# Patient Record
Sex: Female | Born: 1940 | Race: White | Hispanic: No | Marital: Married | Smoking: Never smoker
Health system: Southern US, Community
[De-identification: ages and names within clinical notes are randomized; demographics above are authoritative.]

## PROBLEM LIST (undated history)

## (undated) DIAGNOSIS — M8580 Other specified disorders of bone density and structure, unspecified site: Secondary | ICD-10-CM

## (undated) DIAGNOSIS — T7840XA Allergy, unspecified, initial encounter: Secondary | ICD-10-CM

## (undated) DIAGNOSIS — I10 Essential (primary) hypertension: Secondary | ICD-10-CM

## (undated) DIAGNOSIS — H269 Unspecified cataract: Secondary | ICD-10-CM

## (undated) DIAGNOSIS — K5730 Diverticulosis of large intestine without perforation or abscess without bleeding: Secondary | ICD-10-CM

## (undated) DIAGNOSIS — M810 Age-related osteoporosis without current pathological fracture: Secondary | ICD-10-CM

## (undated) HISTORY — PX: TONSILLECTOMY: SUR1361

## (undated) HISTORY — DX: Allergy, unspecified, initial encounter: T78.40XA

## (undated) HISTORY — DX: Age-related osteoporosis without current pathological fracture: M81.0

## (undated) HISTORY — DX: Essential (primary) hypertension: I10

## (undated) HISTORY — DX: Other specified disorders of bone density and structure, unspecified site: M85.80

## (undated) HISTORY — PX: OOPHORECTOMY: SHX86

## (undated) HISTORY — DX: Unspecified cataract: H26.9

## (undated) HISTORY — PX: COLONOSCOPY: SHX174

## (undated) HISTORY — PX: POLYPECTOMY: SHX149

## (undated) HISTORY — PX: OTHER SURGICAL HISTORY: SHX169

## (undated) HISTORY — DX: Diverticulosis of large intestine without perforation or abscess without bleeding: K57.30

## (undated) HISTORY — PX: TOTAL ABDOMINAL HYSTERECTOMY: SHX209

---

## 2003-02-10 ENCOUNTER — Ambulatory Visit (HOSPITAL_COMMUNITY): Admission: RE | Admit: 2003-02-10 | Discharge: 2003-02-10 | Payer: Self-pay | Admitting: Internal Medicine

## 2003-11-11 ENCOUNTER — Ambulatory Visit: Payer: Self-pay | Admitting: Internal Medicine

## 2003-12-03 ENCOUNTER — Ambulatory Visit: Payer: Self-pay | Admitting: Internal Medicine

## 2003-12-19 ENCOUNTER — Ambulatory Visit: Payer: Self-pay | Admitting: Internal Medicine

## 2004-01-20 ENCOUNTER — Ambulatory Visit: Payer: Self-pay | Admitting: Family Medicine

## 2004-02-11 ENCOUNTER — Ambulatory Visit (HOSPITAL_COMMUNITY): Admission: RE | Admit: 2004-02-11 | Discharge: 2004-02-11 | Payer: Self-pay | Admitting: Internal Medicine

## 2004-03-01 ENCOUNTER — Ambulatory Visit: Payer: Self-pay | Admitting: Internal Medicine

## 2004-03-31 ENCOUNTER — Ambulatory Visit: Payer: Self-pay | Admitting: Internal Medicine

## 2004-11-08 ENCOUNTER — Ambulatory Visit: Payer: Self-pay | Admitting: Internal Medicine

## 2005-02-16 ENCOUNTER — Ambulatory Visit (HOSPITAL_COMMUNITY): Admission: RE | Admit: 2005-02-16 | Discharge: 2005-02-16 | Payer: Self-pay | Admitting: Internal Medicine

## 2005-11-17 ENCOUNTER — Ambulatory Visit: Payer: Self-pay | Admitting: Internal Medicine

## 2005-12-12 ENCOUNTER — Ambulatory Visit: Payer: Self-pay | Admitting: Internal Medicine

## 2005-12-16 ENCOUNTER — Ambulatory Visit: Payer: Self-pay | Admitting: Internal Medicine

## 2006-06-16 ENCOUNTER — Ambulatory Visit (HOSPITAL_COMMUNITY): Admission: RE | Admit: 2006-06-16 | Discharge: 2006-06-16 | Payer: Self-pay | Admitting: Internal Medicine

## 2006-08-28 ENCOUNTER — Telehealth: Payer: Self-pay | Admitting: Internal Medicine

## 2006-09-04 ENCOUNTER — Telehealth: Payer: Self-pay | Admitting: Internal Medicine

## 2006-09-05 ENCOUNTER — Telehealth (INDEPENDENT_AMBULATORY_CARE_PROVIDER_SITE_OTHER): Payer: Self-pay | Admitting: *Deleted

## 2006-10-17 ENCOUNTER — Ambulatory Visit: Payer: Self-pay | Admitting: Internal Medicine

## 2006-10-17 DIAGNOSIS — M810 Age-related osteoporosis without current pathological fracture: Secondary | ICD-10-CM | POA: Insufficient documentation

## 2006-10-17 DIAGNOSIS — M8580 Other specified disorders of bone density and structure, unspecified site: Secondary | ICD-10-CM

## 2006-10-23 ENCOUNTER — Telehealth (INDEPENDENT_AMBULATORY_CARE_PROVIDER_SITE_OTHER): Payer: Self-pay | Admitting: *Deleted

## 2006-10-30 ENCOUNTER — Encounter: Payer: Self-pay | Admitting: Internal Medicine

## 2006-10-30 ENCOUNTER — Ambulatory Visit: Payer: Self-pay | Admitting: Internal Medicine

## 2006-11-02 ENCOUNTER — Ambulatory Visit: Payer: Self-pay | Admitting: Internal Medicine

## 2006-11-03 ENCOUNTER — Telehealth: Payer: Self-pay | Admitting: Internal Medicine

## 2006-12-01 ENCOUNTER — Telehealth: Payer: Self-pay | Admitting: Internal Medicine

## 2006-12-04 ENCOUNTER — Telehealth: Payer: Self-pay | Admitting: Internal Medicine

## 2006-12-05 ENCOUNTER — Encounter: Payer: Self-pay | Admitting: Internal Medicine

## 2006-12-11 ENCOUNTER — Ambulatory Visit: Payer: Self-pay | Admitting: Internal Medicine

## 2007-01-16 ENCOUNTER — Ambulatory Visit: Payer: Self-pay | Admitting: Internal Medicine

## 2007-01-16 LAB — CONVERTED CEMR LAB
Bilirubin Urine: NEGATIVE
Glucose, Urine, Semiquant: NEGATIVE
Nitrite: NEGATIVE
Protein, U semiquant: NEGATIVE
Specific Gravity, Urine: 1.02
Urobilinogen, UA: 0.2
WBC Urine, dipstick: NEGATIVE
pH: 5.5

## 2007-01-17 LAB — CONVERTED CEMR LAB
AST: 25 units/L (ref 0–37)
Alkaline Phosphatase: 41 units/L (ref 39–117)
Basophils Relative: 0.3 % (ref 0.0–1.0)
CO2: 28 meq/L (ref 19–32)
Calcium: 9.6 mg/dL (ref 8.4–10.5)
Cholesterol: 211 mg/dL (ref 0–200)
Eosinophils Absolute: 0.1 10*3/uL (ref 0.0–0.6)
GFR calc Af Amer: 108 mL/min
GFR calc non Af Amer: 89 mL/min
Glucose, Bld: 96 mg/dL (ref 70–99)
Hemoglobin: 13.3 g/dL (ref 12.0–15.0)
MCHC: 35.2 g/dL (ref 30.0–36.0)
MCV: 91.5 fL (ref 78.0–100.0)
Monocytes Absolute: 0.3 10*3/uL (ref 0.2–0.7)
Monocytes Relative: 6.3 % (ref 3.0–11.0)
Platelets: 264 10*3/uL (ref 150–400)
RBC: 4.13 M/uL (ref 3.87–5.11)
RDW: 12.9 % (ref 11.5–14.6)
TSH: 0.87 microintl units/mL (ref 0.35–5.50)
Total Bilirubin: 1.1 mg/dL (ref 0.3–1.2)
Total Protein: 7 g/dL (ref 6.0–8.3)

## 2007-01-19 ENCOUNTER — Ambulatory Visit: Payer: Self-pay | Admitting: Cardiology

## 2007-01-22 ENCOUNTER — Telehealth: Payer: Self-pay | Admitting: Internal Medicine

## 2007-01-29 ENCOUNTER — Encounter: Payer: Self-pay | Admitting: Internal Medicine

## 2007-02-14 ENCOUNTER — Encounter: Payer: Self-pay | Admitting: Internal Medicine

## 2007-06-11 ENCOUNTER — Other Ambulatory Visit: Admission: RE | Admit: 2007-06-11 | Discharge: 2007-06-11 | Payer: Self-pay | Admitting: Internal Medicine

## 2007-06-11 ENCOUNTER — Ambulatory Visit: Payer: Self-pay | Admitting: Internal Medicine

## 2007-06-11 ENCOUNTER — Encounter: Payer: Self-pay | Admitting: Internal Medicine

## 2007-06-12 ENCOUNTER — Encounter: Payer: Self-pay | Admitting: Internal Medicine

## 2007-06-12 LAB — CONVERTED CEMR LAB
WBC, Wet Prep HPF POC: NONE SEEN
Yeast Wet Prep HPF POC: NONE SEEN

## 2007-06-20 ENCOUNTER — Ambulatory Visit (HOSPITAL_COMMUNITY): Admission: RE | Admit: 2007-06-20 | Discharge: 2007-06-20 | Payer: Self-pay | Admitting: Internal Medicine

## 2007-08-07 ENCOUNTER — Telehealth: Payer: Self-pay | Admitting: Internal Medicine

## 2007-09-12 ENCOUNTER — Encounter: Payer: Self-pay | Admitting: Internal Medicine

## 2007-10-31 ENCOUNTER — Ambulatory Visit: Payer: Self-pay | Admitting: Internal Medicine

## 2008-10-07 ENCOUNTER — Ambulatory Visit (HOSPITAL_COMMUNITY): Admission: RE | Admit: 2008-10-07 | Discharge: 2008-10-07 | Payer: Self-pay | Admitting: Internal Medicine

## 2008-10-15 ENCOUNTER — Encounter (INDEPENDENT_AMBULATORY_CARE_PROVIDER_SITE_OTHER): Payer: Self-pay | Admitting: *Deleted

## 2008-11-27 ENCOUNTER — Encounter (INDEPENDENT_AMBULATORY_CARE_PROVIDER_SITE_OTHER): Payer: Self-pay | Admitting: *Deleted

## 2008-12-01 ENCOUNTER — Ambulatory Visit: Payer: Self-pay | Admitting: Internal Medicine

## 2008-12-10 ENCOUNTER — Ambulatory Visit: Payer: Self-pay | Admitting: Internal Medicine

## 2008-12-15 ENCOUNTER — Ambulatory Visit: Payer: Self-pay | Admitting: Internal Medicine

## 2008-12-15 ENCOUNTER — Encounter: Payer: Self-pay | Admitting: Internal Medicine

## 2008-12-24 ENCOUNTER — Ambulatory Visit: Payer: Self-pay | Admitting: Internal Medicine

## 2009-01-12 ENCOUNTER — Telehealth: Payer: Self-pay | Admitting: Internal Medicine

## 2009-03-02 ENCOUNTER — Telehealth: Payer: Self-pay | Admitting: Internal Medicine

## 2009-10-13 ENCOUNTER — Ambulatory Visit: Payer: Self-pay | Admitting: Internal Medicine

## 2009-10-29 ENCOUNTER — Ambulatory Visit (HOSPITAL_COMMUNITY): Admission: RE | Admit: 2009-10-29 | Discharge: 2009-10-29 | Payer: Self-pay | Admitting: Internal Medicine

## 2010-01-28 ENCOUNTER — Other Ambulatory Visit: Payer: Self-pay | Admitting: Internal Medicine

## 2010-01-28 ENCOUNTER — Ambulatory Visit
Admission: RE | Admit: 2010-01-28 | Discharge: 2010-01-28 | Payer: Self-pay | Source: Home / Self Care | Attending: Internal Medicine | Admitting: Internal Medicine

## 2010-01-28 LAB — BASIC METABOLIC PANEL
BUN: 10 mg/dL (ref 6–23)
CO2: 30 mEq/L (ref 19–32)
Calcium: 9.4 mg/dL (ref 8.4–10.5)
Chloride: 102 mEq/L (ref 96–112)
Creatinine, Ser: 0.7 mg/dL (ref 0.4–1.2)
GFR: 89.81 mL/min (ref >60.00)
Glucose, Bld: 97 mg/dL (ref 70–99)
Potassium: 4.7 mEq/L (ref 3.5–5.1)
Sodium: 139 mEq/L (ref 135–145)

## 2010-01-28 LAB — LIPID PANEL
Cholesterol: 211 mg/dL — ABNORMAL HIGH (ref 0–200)
HDL: 91.4 mg/dL (ref >39.00)
Total CHOL/HDL Ratio: 2
Triglycerides: 51 mg/dL (ref 0.0–149.0)
VLDL: 10.2 mg/dL (ref 0.0–40.0)

## 2010-01-28 LAB — HEPATIC FUNCTION PANEL
ALT: 16 U/L (ref 0–35)
AST: 21 U/L (ref 0–37)
Albumin: 4.4 g/dL (ref 3.5–5.2)
Alkaline Phosphatase: 57 U/L (ref 39–117)
Bilirubin, Direct: 0.2 mg/dL (ref 0.0–0.3)
Total Bilirubin: 0.7 mg/dL (ref 0.3–1.2)
Total Protein: 7.1 g/dL (ref 6.0–8.3)

## 2010-01-28 LAB — CONVERTED CEMR LAB
Nitrite: NEGATIVE
Protein, U semiquant: NEGATIVE
pH: 7

## 2010-01-28 LAB — CBC WITH DIFFERENTIAL/PLATELET
Basophils Absolute: 0 10*3/uL (ref 0.0–0.1)
Basophils Relative: 0.8 % (ref 0.0–3.0)
Eosinophils Absolute: 0.1 10*3/uL (ref 0.0–0.7)
Eosinophils Relative: 3.2 % (ref 0.0–5.0)
HCT: 38.9 % (ref 36.0–46.0)
Hemoglobin: 13.1 g/dL (ref 12.0–15.0)
Lymphocytes Relative: 47.1 % — ABNORMAL HIGH (ref 12.0–46.0)
Lymphs Abs: 1.9 10*3/uL (ref 0.7–4.0)
MCHC: 33.8 g/dL (ref 30.0–36.0)
MCV: 93.7 fl (ref 78.0–100.0)
Monocytes Absolute: 0.3 10*3/uL (ref 0.1–1.0)
Monocytes Relative: 6.5 % (ref 3.0–12.0)
Neutro Abs: 1.7 10*3/uL (ref 1.4–7.7)
Neutrophils Relative %: 42.4 % — ABNORMAL LOW (ref 43.0–77.0)
Platelets: 233 10*3/uL (ref 150.0–400.0)
RBC: 4.15 Mil/uL (ref 3.87–5.11)
RDW: 14 % (ref 11.5–14.6)
WBC: 4.1 10*3/uL — ABNORMAL LOW (ref 4.5–10.5)

## 2010-01-28 LAB — TSH: TSH: 1.16 u[IU]/mL (ref 0.35–5.50)

## 2010-01-28 LAB — LDL CHOLESTEROL, DIRECT: Direct LDL: 105.8 mg/dL

## 2010-01-30 ENCOUNTER — Encounter: Payer: Self-pay | Admitting: Internal Medicine

## 2010-02-09 ENCOUNTER — Ambulatory Visit
Admission: RE | Admit: 2010-02-09 | Discharge: 2010-02-09 | Payer: Self-pay | Source: Home / Self Care | Attending: Internal Medicine | Admitting: Internal Medicine

## 2010-02-09 DIAGNOSIS — K219 Gastro-esophageal reflux disease without esophagitis: Secondary | ICD-10-CM | POA: Insufficient documentation

## 2010-02-09 DIAGNOSIS — G4700 Insomnia, unspecified: Secondary | ICD-10-CM | POA: Insufficient documentation

## 2010-02-09 NOTE — Progress Notes (Signed)
Summary: returning a call  Phone Note Call from Patient Call back at [phone]   Caller: Spouse-live call Summary of Call: [facility]  Initial call taken by: Warnell Forester,  January 12, 2009 8:20 AM  Follow-up for Phone Call        Pt given results. Follow-up by: Lynann Beaver CMA,  January 12, 2009 9:59 AM     Appended Document: returning a call Called again & left voice message that she is returning call from Triage.  Called back to number left [phone] and got voice mail.  Left message to call back if she has any questions or problems, that there are not any messages to relay.  Appended Document: returning a call Called back & left another message and different number [phone].  Bone density test results.    Appended Document: returning a call Called patient & gave Dr. Cato Mulligan report.

## 2010-02-09 NOTE — Assessment & Plan Note (Signed)
Summary: flu shot//ccm   Nurse Visit   Allergies: 1)  ! Fosamax (Alendronate Sodium) 2)  ! Actonel (Risedronate Sodium)  Immunizations Administered:  Influenza Vaccine # 1:    Vaccine Type: Fluvax 3+    Site: left deltoid    Mfr: [employer]oSmith[employer]    Dose: 0.5 ml    Route: IM    Given by: Kathrynn Speed CMA    Exp. Date: 07/10/2010    Lot #: [id number]    VIS given: 08/04/09 version given October 13, 2009.  Flu Vaccine Consent Questions:    Do you have a history of severe allergic reactions to this vaccine? no    Any prior history of allergic reactions to egg and/or gelatin? no    Do you have a sensitivity to the preservative Thimersol? no    Do you have a past history of Guillan-Barre Syndrome? no    Do you currently have an acute febrile illness? no    Have you ever had a severe reaction to latex? no    Vaccine information given and explained to patient? yes    Are you currently pregnant? no  Orders Added: 1)  Flu Vaccine 70yrs + [90658] 2)  Admin 1st Vaccine [54270]

## 2010-02-09 NOTE — Progress Notes (Signed)
Summary: OTC OR RX FOR SWIMMER EAR  Phone Note Call from Patient Call back at Home Phone [phone]   Caller: Patient   [phone] Call For: Birdie Sons MD Summary of Call: PT WOULD LIKE EAR DROPS FOR SWIMMERS EAR . [facility] [phone] Initial call taken by: Heron Sabins,  March 02, 2009 9:30 AM  Follow-up for Phone Call        what are the symptoms? Follow-up by: Birdie Sons MD,  March 02, 2009 10:58 AM  Additional Follow-up for Phone Call Additional follow up Details #1::        Pt adv that she has been swimming at the [facility] recently and  since has been exp L ear pain, painful to the touch... pt denies any fever or congestion. Additional Follow-up by: Debbra Riding,  March 02, 2009 12:21 PM    Additional Follow-up for Phone Call Additional follow up Details #2::    cortisporin otic to affected ear four times a day for 5 days Follow-up by: Birdie Sons MD,  March 02, 2009 12:59 PM  Additional Follow-up for Phone Call Additional follow up Details #3:: Details for Additional Follow-up Action Taken: see Rx.Patient notified.  Additional Follow-up by: Gladis Riffle, RN,  March 02, 2009 2:28 PM  New/Updated Medications: CORTISPORIN 3.5-10000-1 SOLN [facility]) One drop in affected ear four times a day x 5 days Prescriptions: CORTISPORIN 3.5-10000-1 SOLN (NEOMYCIN-POLYMYXIN-HC) One drop in affected ear four times a day x 5 days  #1 bottle x 0   Entered by:   Gladis Riffle, RN   Authorized by:   Birdie Sons MD   Signed by:   Gladis Riffle, RN on 03/02/2009   Method used:   [facility]        [phone][facility] (retail)       [address]       [place], [place]  [address]       Ph: [phone] or [phone]       Fax: [phone]   RxID:   [id number]

## 2010-02-17 NOTE — Assessment & Plan Note (Signed)
Summary: cpx/cjr   Vital Signs:  Patient profile:   70 year old female Height:      63.5 inches Weight:      151 pounds BMI:     26.42 Temp:     98.1 degrees F oral Pulse rate:   64 / minute Pulse rhythm:   regular BP sitting:   104 / 80  (left arm) Cuff size:   regular  Vitals Entered By: Alfred Levins, CMA (February 09, 2010 9:17 AM) CC: cpx, no pap   CC:  cpx and no pap.  History of Present Illness: cpx  she has 2 new complaints GERD---she started OTC PPI with success  Insomnia---ongoing for months- years. she wakes up q 2 hours. she is able to fall back asleep sometimes can take an hour. she does not nap during the day.   All other systems reviewed and were negative   Current Problems (verified): 1)  Preventive Health Care  (ICD-V70.0) 2)  Osteopenia  (ICD-733.90)  Current Medications (verified): 1)  Albuterol 90 Mcg/act Aers (Albuterol) .... Inhale 2 Puff As Directed Four Times A  Day Prn 2)  Vitamin D3 5000 Unit Caps (Cholecalciferol) .... Once Daily 3)  Aspirin 81 Mg  Tbec (Aspirin) .... One By Mouth Every Day 4)  Prevacid 30 Mg Cpdr (Lansoprazole) .Marland Kitchen.. 1 By Mouth Once Daily  Allergies (verified): 1)  ! Fosamax (Alendronate Sodium) 2)  ! Actonel (Risedronate Sodium)  Past History:  Past Surgical History: Last updated: 07/10/2006 Hysterectomy, uterine hyperplasia, no ca Oophorectomy, BSO  age 72  Family History: Last updated: 01-31-2007 mother decease 47 yo hx of CAD (elderly). Hx of CAD earlier in life father colon ca Family History of Colon CA 1st degree relative   Social History: Last updated: January 31, 2007 Married Never Smoked Regular exercise-no Alcohol use-no Regular exercise-yes walks daily  Risk Factors: Exercise: yes (2007-01-31)  Risk Factors: Smoking Status: never (12/11/2006)  Physical Exam  General:  alert and well-developed.   Head:  normocephalic and atraumatic.   Eyes:  pupils equal and pupils round.   Ears:  R ear normal  and L ear normal.   Nose:  no external deformity and no external erythema.   Neck:  No deformities, masses, or tenderness noted. Lungs:  normal respiratory effort and no intercostal retractions.   Heart:  normal rate and regular rhythm.   Abdomen:  soft and non-tender.   Msk:  No deformity or scoliosis noted of thoracic or lumbar spine.   Neurologic:  cranial nerves II-XII intact and gait normal.   Skin:  turgor normal and color normal.   Psych:  good eye contact and not anxious appearing.     Impression & Recommendations:  Problem # 1:  PREVENTIVE HEALTH CARE (ICD-V70.0) health maint UTD encouraged to maintain excellent health habits  Problem # 2:  GERD (ICD-530.81) discussed I've advised her to minimize medicaitons---she is taking OTC med Her updated medication list for this problem includes:    Prevacid15Mg  Cpdr (Lansoprazole) .Marland Kitchen... 1 by mouth once daily  Problem # 3:  INSOMNIA-SLEEP DISORDER-UNSPEC (ICD-780.52) discussed trial melatonin  Complete Medication List: 1)  Albuterol 90 Mcg/act Aers (Albuterol) .... Inhale 2 puff as directed four times a  day prn 2)  Vitamin D3 5000 Unit Caps (Cholecalciferol) .... Once daily 3)  Aspirin 81 Mg Tbec (Aspirin) .... One by mouth every day 4)  Prevacid 24hr 15 Mg Cpdr (Lansoprazole) .... Take 1 tablet by mouth once a day 5)  Melatonin 2.5 Mg  Caps (Melatonin) .Marland Kitchen.. 1-2 by mouth at bedtime as needed insomnia.   Patient Instructions: 1)  . Prescriptions: ALBUTEROL 90 MCG/ACT AERS (ALBUTEROL) Inhale 2 puff as directed four times a  day prn  #1 x 0   Entered and Authorized by:   Birdie Sons MD   Signed by:   Birdie Sons MD on 02/09/2010   Method used:   [facility] to        [phone][facility] (retail)       [address]       [place], [place]  [address]       Ph: [phone] or [phone]       Fax: [phone]   RxID:   [id number]    Orders Added: 1)  Est. Patient Level III [36644] 2)  Est. Patient 65& >  [03474]

## 2010-10-04 ENCOUNTER — Other Ambulatory Visit: Payer: Self-pay | Admitting: Internal Medicine

## 2010-10-04 DIAGNOSIS — Z1231 Encounter for screening mammogram for malignant neoplasm of breast: Secondary | ICD-10-CM

## 2010-10-12 ENCOUNTER — Ambulatory Visit (INDEPENDENT_AMBULATORY_CARE_PROVIDER_SITE_OTHER): Payer: MEDICARE

## 2010-10-12 DIAGNOSIS — Z23 Encounter for immunization: Secondary | ICD-10-CM

## 2010-11-01 ENCOUNTER — Ambulatory Visit (HOSPITAL_COMMUNITY)
Admission: RE | Admit: 2010-11-01 | Discharge: 2010-11-01 | Disposition: A | Discharge status: Home / Self Care | Payer: Medicare Other | Source: Ambulatory Visit | Attending: Internal Medicine | Admitting: Internal Medicine

## 2010-11-01 DIAGNOSIS — Z1231 Encounter for screening mammogram for malignant neoplasm of breast: Secondary | ICD-10-CM | POA: Insufficient documentation

## 2011-05-04 ENCOUNTER — Telehealth: Payer: Self-pay | Admitting: *Deleted

## 2011-05-04 NOTE — Telephone Encounter (Signed)
agree

## 2011-05-04 NOTE — Telephone Encounter (Signed)
Pt. Called c/o what sounded alike an allergic reaction.  Had some swelling of the lips and cheek, but went away.  Advised to take a dose of Benadryl, and scheduled appt with [facility] tomorrow.  She was instructed to go to the ER tonight with any change such as difficulty breathing or if the swelling goes to the tongue or any SOB.

## 2011-05-05 ENCOUNTER — Encounter: Payer: Self-pay | Admitting: Family

## 2011-05-05 ENCOUNTER — Ambulatory Visit (INDEPENDENT_AMBULATORY_CARE_PROVIDER_SITE_OTHER): Payer: MEDICARE | Admitting: Family

## 2011-05-05 VITALS — BP 120/80 | Temp 98.1°F | Wt 147.0 lb

## 2011-05-05 DIAGNOSIS — T7840XA Allergy, unspecified, initial encounter: Secondary | ICD-10-CM

## 2011-05-05 DIAGNOSIS — R220 Localized swelling, mass and lump, head: Secondary | ICD-10-CM

## 2011-05-05 DIAGNOSIS — R221 Localized swelling, mass and lump, neck: Secondary | ICD-10-CM

## 2011-05-05 MED ORDER — METHYLPREDNISOLONE ACETATE 40 MG/ML IJ SUSP: 80.0000 mg | Freq: Once | INTRAMUSCULAR | Status: DC

## 2011-05-05 MED ORDER — METHYLPREDNISOLONE ACETATE 80 MG/ML IJ SUSP
80.0000 mg | Freq: Once | INTRAMUSCULAR | Status: AC
  Administered 2011-05-05: 80 mg via INTRAMUSCULAR

## 2011-05-05 NOTE — Progress Notes (Signed)
  Subjective:    Patient ID: Ruth Wilson, female    DOB: May 23, 1940, 71 y.o.   MRN: [id number]  HPI Patient is in today with complaints of left-sided facial swelling x1 day. Reports the swelling beginning shortly after eating a stress thallium yesterday for lunch. Denies any known allergens. She has been taken Benadryl that has helped her symptoms. Denies any shortness of breath, lightheadedness, dizziness, chest pain, palpitations.   Review of Systems  Constitutional: Negative.   HENT: Positive for facial swelling. Negative for congestion, sore throat, rhinorrhea and sinus pressure.        Left side facial swelling that has improved.  Respiratory: Negative.   Cardiovascular: Negative.   Skin: Negative.   Neurological: Negative.   Hematological: Negative.   Psychiatric/Behavioral: Negative.    No past medical history on file.  History   Social History  . Marital Status: Married    Spouse Name: N/A    Number of Children: N/A  . Years of Education: N/A   Occupational History  . Not on file.   Social History Main Topics  . Smoking status: Never Smoker   . Smokeless tobacco: Not on file  . Alcohol Use: Yes  . Drug Use: No  . Sexually Active: Not on file   Other Topics Concern  . Not on file   Social History Narrative  . No narrative on file    No past surgical history on file.  No family history on file.  Allergies  Allergen Reactions  . Alendronate Sodium     REACTION: GI problems  . Risedronate Sodium     REACTION: GI problems    No current outpatient prescriptions on file prior to visit.   No current facility-administered medications on file prior to visit.    BP 120/80  Temp(Src) 98.1 F (36.7 C) (Oral)  Wt 147 lb (66.679 kg)chart    Objective:   Physical Exam  Constitutional: She is oriented to person, place, and time. She appears well-developed and well-nourished.  HENT:  Right Ear: External ear normal.  Left Ear: External ear normal.    Nose: Nose normal.  Mouth/Throat: Oropharynx is clear and moist.       Very mild swelling noted to the left face.  Neck: Normal range of motion. Neck supple.  Cardiovascular: Normal rate, regular rhythm and normal heart sounds.   Pulmonary/Chest: Effort normal and breath sounds normal.  Musculoskeletal: Normal range of motion.  Neurological: She is alert and oriented to person, place, and time.  Skin: Skin is warm and dry.  Psychiatric: She has a normal mood and affect.     Depo-Medrol 80mg  IM x 1     Assessment & Plan:  Assessment: Allergic Reaction, Facial Swelling   Plan: Benadryl PRN. Call the office if symptoms worsen or persist.

## 2011-05-18 ENCOUNTER — Telehealth: Payer: Self-pay | Admitting: *Deleted

## 2011-05-18 MED ORDER — PREDNISONE 10 MG PO TABS: 10.0000 mg | ORAL_TABLET | Freq: Every day | ORAL | Status: DC

## 2011-05-18 NOTE — Telephone Encounter (Signed)
Pt left town and began getting worse.  Asking for a Prednisone Dose Pack.  Prednisone 10 mg. !@day  dose pack # 48. [phone]

## 2011-10-26 ENCOUNTER — Ambulatory Visit (INDEPENDENT_AMBULATORY_CARE_PROVIDER_SITE_OTHER): Payer: Medicare Other

## 2011-10-26 DIAGNOSIS — Z23 Encounter for immunization: Secondary | ICD-10-CM

## 2011-11-24 ENCOUNTER — Other Ambulatory Visit: Payer: Self-pay | Admitting: Internal Medicine

## 2011-11-24 DIAGNOSIS — Z1231 Encounter for screening mammogram for malignant neoplasm of breast: Secondary | ICD-10-CM

## 2011-12-15 ENCOUNTER — Ambulatory Visit (HOSPITAL_COMMUNITY)
Admission: RE | Admit: 2011-12-15 | Discharge: 2011-12-15 | Disposition: A | Discharge status: Home / Self Care | Payer: Medicare Other | Source: Ambulatory Visit | Attending: Internal Medicine | Admitting: Internal Medicine

## 2011-12-15 DIAGNOSIS — Z1231 Encounter for screening mammogram for malignant neoplasm of breast: Secondary | ICD-10-CM | POA: Insufficient documentation

## 2012-03-08 ENCOUNTER — Ambulatory Visit (INDEPENDENT_AMBULATORY_CARE_PROVIDER_SITE_OTHER): Payer: Medicare Other | Admitting: Family

## 2012-03-08 ENCOUNTER — Encounter: Payer: Self-pay | Admitting: Family

## 2012-03-08 VITALS — BP 128/80 | HR 71 | Wt 138.0 lb

## 2012-03-08 DIAGNOSIS — M25559 Pain in unspecified hip: Secondary | ICD-10-CM

## 2012-03-08 DIAGNOSIS — M76899 Other specified enthesopathies of unspecified lower limb, excluding foot: Secondary | ICD-10-CM

## 2012-03-08 DIAGNOSIS — M25552 Pain in left hip: Secondary | ICD-10-CM

## 2012-03-08 DIAGNOSIS — M7072 Other bursitis of hip, left hip: Secondary | ICD-10-CM

## 2012-03-08 NOTE — Progress Notes (Signed)
  Subjective:    Patient ID: Ruth Wilson, female    DOB: 1940/12/24, 72 y.o.   MRN: [id number]  HPI 72 year old white female, nonsmoker, patient of Dr. Cato Mulligan is in today with complaints of left hip pain is particularly worse after walking. Reports exercising daily. Has had pain in her hip x6 months and worsening that she rates about 8-9/10. Has not taken any medications over-the-counter. The hip is better with rest.   Review of Systems  Constitutional: Negative.   Respiratory: Negative.   Cardiovascular: Negative.   Gastrointestinal: Negative.   Musculoskeletal: Positive for arthralgias.       Hip pain  Skin: Negative.   Neurological: Negative.   Hematological: Negative.   Psychiatric/Behavioral: Negative.    No past medical history on file.  History   Social History  . Marital Status: Married    Spouse Name: N/A    Number of Children: N/A  . Years of Education: N/A   Occupational History  . Not on file.   Social History Main Topics  . Smoking status: Never Smoker   . Smokeless tobacco: Not on file  . Alcohol Use: Yes  . Drug Use: No  . Sexually Active: Not on file   Other Topics Concern  . Not on file   Social History Narrative  . No narrative on file    No past surgical history on file.  No family history on file.  Allergies  Allergen Reactions  . Alendronate Sodium     REACTION: GI problems  . Risedronate Sodium     REACTION: GI problems    Current Outpatient Prescriptions on File Prior to Visit  Medication Sig Dispense Refill  . ofloxacin (OCUFLOX) 0.3 % ophthalmic solution       . predniSONE (DELTASONE) 10 MG tablet Take 1 tablet (10 mg total) by mouth daily. Take as directed.  48 tablet  0   Current Facility-Administered Medications on File Prior to Visit  Medication Dose Route Frequency Provider Last Rate Last Dose  . methylPREDNISolone acetate (DEPO-MEDROL) injection 80 mg  80 mg Intramuscular Once Baker Pierini, FNP        BP  128/80  Pulse 71  Wt 138 lb (62.596 kg)  BMI 24.06 kg/m2  SpO2 98%chart    Objective:   Physical Exam  Constitutional: She is oriented to person, place, and time. She appears well-developed and well-nourished.  Neck: Normal range of motion. Neck supple.  Cardiovascular: Normal rate, regular rhythm and normal heart sounds.   Pulmonary/Chest: Effort normal and breath sounds normal.  Musculoskeletal: She exhibits tenderness. She exhibits no edema.  Neurological: She is alert and oriented to person, place, and time.  Skin: Skin is warm and dry.  Psychiatric: She has a normal mood and affect.          Assessment & Plan:  Assessment:  1. Left hip bursitis  2. Left hip osteoarthritis  Plan: X-ray of the left hip notify patient pending results. Consider Mobic 7-1/2 mg once daily for inflammation pending the results of the x-ray. Home care structures given. Patient call the office if symptoms worsen or persist. Recheck a schedule, and as needed.

## 2012-03-08 NOTE — Patient Instructions (Signed)
Hip Bursitis Bursitis is a swelling and soreness (inflammation) of a fluid-filled sac (bursa). This sac overlies and protects the joints.  CAUSES   Injury.  Overuse of the muscles surrounding the joint.  Arthritis.  Gout.  Infection.  Cold weather.  Inadequate warm-up and conditioning prior to activities. The cause may not be known.  SYMPTOMS   Mild to severe irritation.  Tenderness and swelling over the outside of the hip.  Pain with motion of the hip.  If the bursa becomes infected, a fever may be present. Redness, tenderness, and warmth will develop over the hip. Symptoms usually lessen in 3 to 4 weeks with treatment, but can come back. TREATMENT If conservative treatment does not work, your caregiver may advise draining the bursa and injecting cortisone into the area. This may speed up the healing process. This may also be used as an initial treatment of choice. HOME CARE INSTRUCTIONS   Apply ice to the affected area for 15 to 20 minutes every 3 to 4 hours while awake for the first 2 days. Put the ice in a plastic bag and place a towel between the bag of ice and your skin.  Rest the painful joint as much as possible, but continue to put the joint through a normal range of motion at least 4 times per day. When the pain lessens, begin normal, slow movements and usual activities to help prevent stiffness of the hip.  Only take over-the-counter or prescription medicines for pain, discomfort, or fever as directed by your caregiver.  Use crutches to limit weight bearing on the hip joint, if advised.  Elevate your painful hip to reduce swelling. Use pillows for propping and cushioning your legs and hips.  Gentle massage may provide comfort and decrease swelling. SEEK IMMEDIATE MEDICAL CARE IF:   Your pain increases even during treatment, or you are not improving.  You have a fever.  You have heat and inflammation over the involved bursa.  You have any other questions  or concerns. MAKE SURE YOU:   Understand these instructions.  Will watch your condition.  Will get help right away if you are not doing well or get worse. Document Released: 06/18/2001 Document Revised: 03/21/2011 Document Reviewed: 01/16/2008 ExitCare Patient Information 2013 [employer].  

## 2012-03-13 ENCOUNTER — Ambulatory Visit (INDEPENDENT_AMBULATORY_CARE_PROVIDER_SITE_OTHER)
Admission: RE | Admit: 2012-03-13 | Discharge: 2012-03-13 | Disposition: A | Discharge status: Home / Self Care | Payer: Medicare Other | Source: Ambulatory Visit | Attending: Family | Admitting: Family

## 2012-03-13 DIAGNOSIS — M25552 Pain in left hip: Secondary | ICD-10-CM

## 2012-03-13 DIAGNOSIS — M7072 Other bursitis of hip, left hip: Secondary | ICD-10-CM

## 2012-03-13 DIAGNOSIS — M25559 Pain in unspecified hip: Secondary | ICD-10-CM

## 2012-03-13 DIAGNOSIS — M76899 Other specified enthesopathies of unspecified lower limb, excluding foot: Secondary | ICD-10-CM

## 2012-03-19 ENCOUNTER — Telehealth: Payer: Self-pay | Admitting: Internal Medicine

## 2012-03-19 NOTE — Telephone Encounter (Signed)
Pt seen Padonda 

## 2012-03-19 NOTE — Telephone Encounter (Signed)
Patient called stating that she would like a call back with xray results. Please assist.

## 2012-03-20 NOTE — Telephone Encounter (Signed)
Pt aware.

## 2012-03-20 NOTE — Telephone Encounter (Signed)
Xray shows arthritis. An anti-inflammatory would be beneficial. Aleve or Ibuprofen.

## 2012-03-20 NOTE — Telephone Encounter (Signed)
Attempted to call pt. No answer and no voicemail. Will try again later

## 2012-03-20 NOTE — Telephone Encounter (Signed)
Patient called back. I explained you were unable to [facility]. Please call her w/results. Call at same #.

## 2012-04-20 ENCOUNTER — Telehealth: Payer: Self-pay | Admitting: Internal Medicine

## 2012-04-20 NOTE — Telephone Encounter (Signed)
Patient Information:  Caller Name: Makaley  Phone: [phone]  Patient: Ruth Wilson, Ruth Wilson  Gender: Female  DOB: 07/15/1940  Age: 72 Years  PCP: Birdie Sons (Adults only)  Office Follow Up:  Does the office need to follow up with this patient?: Yes  Instructions For The Office: OFFICE PT WANTS TO KNOW IF SHE SHOULD COME TO THE OFFICE TO BE SEEN WHEN SHE RETURNS FROM OUT OF TOWN OR IF SHE SHOULD SET UP APPT WITH HER GI DOCTOR.  PT IS LOOKING FOR AN APPT ON 04/27/12.  PLEASE FOLLOW UP WITH PT  RN Note:  pt states she notices blood dripping into toliet.  Symptoms  Reason For Call & Symptoms: rectal bleeding (heavier than normal).  Pt is out of town currently. Pt states pt has had this before  Reviewed Health History In EMR: Yes  Reviewed Medications In EMR: Yes  Reviewed Allergies In EMR: Yes  Reviewed Surgeries / Procedures: Yes  Date of Onset of Symptoms: 04/18/2012  Guideline(s) Used:  Rectal Bleeding  Disposition Per Guideline:   See Within 2 Weeks in Office  Reason For Disposition Reached:   Rectal bleeding is minimal (e.g., blood just on toilet paper, a few drops in toilet bowl)  Advice Given:  Warm SITZ Baths Twice a Day:   Sit in a warm saline bath for 20 minutes 2 times daily to cleanse the rectal area and to promote healing.  You can add 2 ounces (57 grams) of table salt or baking soda to each tub of water.  General Instructions for Treating and Preventing Constipation:   Eat a high fiber diet.  Drink adequate liquids.  Call Back If:  Bleeding increases in amount  You become worse.  Patient Will Follow Care Advice:  YES

## 2012-04-21 NOTE — Telephone Encounter (Signed)
Okay to see a provider at our office or to see GI.

## 2012-04-23 NOTE — Telephone Encounter (Signed)
Pt wants to follow up with GI.  She will call them

## 2012-04-24 ENCOUNTER — Telehealth: Payer: Self-pay | Admitting: Internal Medicine

## 2012-04-24 DIAGNOSIS — K625 Hemorrhage of anus and rectum: Secondary | ICD-10-CM

## 2012-04-24 NOTE — Telephone Encounter (Signed)
Patient is in [facility] visiting. She is calling to report last Wednesday, she had some bright, red rectal bleeding. It dripped into the toilet. States this lasted 3 days. She has had some rectal bleeding in the past but not this much. The amount has decreased to just bright, red blood on the tissue when she wipes. She is concerned because of the amount and extended time of the bleeding. She had used some Desitin cream. She will be home on Thursday. HX of colon ca in father. Last colon 2010- moderate diverticulosis. Please, advise.

## 2012-04-24 NOTE — Telephone Encounter (Signed)
Spoke with patient and she will not be back until late Thursday night. Scheduled OV with Mike Gip, PA on 04/30/12 at 9:30 AM. Lab in [facility] also. Patient understands to seek ED care if needed prior to this.

## 2012-04-24 NOTE — Telephone Encounter (Signed)
She will need to be seen when she comes back, have CBC checked  ASAP.r/o div erticular bleed vs hemorrhoids.

## 2012-04-30 ENCOUNTER — Other Ambulatory Visit (INDEPENDENT_AMBULATORY_CARE_PROVIDER_SITE_OTHER): Payer: Medicare Other

## 2012-04-30 ENCOUNTER — Encounter: Payer: Self-pay | Admitting: Physician Assistant

## 2012-04-30 ENCOUNTER — Ambulatory Visit (INDEPENDENT_AMBULATORY_CARE_PROVIDER_SITE_OTHER): Payer: Medicare Other | Admitting: Physician Assistant

## 2012-04-30 VITALS — BP 124/82 | HR 76 | Ht 63.5 in | Wt 138.8 lb

## 2012-04-30 DIAGNOSIS — K625 Hemorrhage of anus and rectum: Secondary | ICD-10-CM

## 2012-04-30 DIAGNOSIS — K5730 Diverticulosis of large intestine without perforation or abscess without bleeding: Secondary | ICD-10-CM

## 2012-04-30 DIAGNOSIS — Z800 Family history of malignant neoplasm of digestive organs: Secondary | ICD-10-CM

## 2012-04-30 HISTORY — DX: Diverticulosis of large intestine without perforation or abscess without bleeding: K57.30

## 2012-04-30 LAB — CBC WITH DIFFERENTIAL/PLATELET
Basophils Absolute: 0 10*3/uL (ref 0.0–0.1)
Eosinophils Relative: 2.7 % (ref 0.0–5.0)
Hemoglobin: 12.5 g/dL (ref 12.0–15.0)
Lymphocytes Relative: 43.4 % (ref 12.0–46.0)
Lymphs Abs: 2.2 10*3/uL (ref 0.7–4.0)
Monocytes Absolute: 0.4 10*3/uL (ref 0.1–1.0)
Neutrophils Relative %: 44.8 % (ref 43.0–77.0)
Platelets: 252 10*3/uL (ref 150.0–400.0)
RBC: 4.09 Mil/uL (ref 3.87–5.11)
WBC: 5.1 10*3/uL (ref 4.5–10.5)

## 2012-04-30 MED ORDER — HYDROCORTISONE ACETATE 25 MG RE SUPP: RECTAL | Status: DC

## 2012-04-30 NOTE — Progress Notes (Signed)
I agree with treating her hemorrhoids first. DB

## 2012-04-30 NOTE — Progress Notes (Signed)
Subjective:    Patient ID: Ruth Wilson, female    DOB: 12/02/1940, 72 y.o.   MRN: [id number]  HPI Keonna is a pleasant 72 year old white female known to Dr. Juanda Chance from prior colonoscopies. She has had 3 previous colonoscopies without polyps. Last exam was done in December of 2010 and showed moderate diverticulosis, but no polyps or hemorrhoids noted. Patient does have family history of colon cancer in her father. She comes in today with complaints of rectal bleeding which she says she has had periodically over the last several years and has attributed to hemorrhoids. She says every now and then she will have an episode of bleeding that will last for a day or 2 and then resolve .She says this has gone on at least as far back as 9 or 10 years. She had an episode on 03/18/2012 she says was fairly heavy probably passing a half a cup of blood. She said she initially noted blood on the tissue with a bowel movement and then actually had drooping of blood into the commode. She did not feel that there was blood mixed with the bowel movement. This episode lasted for 5-6 days with intermittent bright red blood only with bowel movements and then stopped. He had no associated abdominal pain or cramping or changes in her bowel habits. Yesterday she noted blood again with the bowel movement and saw bright red blood in the commode. She  has not had any further bowel movements or bleeding. She does take a baby aspirin once daily    Review of Systems  Constitutional: Negative.   HENT: Negative.   Eyes: Negative.   Respiratory: Negative.   Cardiovascular: Negative.   Gastrointestinal: Positive for blood in stool and anal bleeding.  Endocrine: Negative.   Genitourinary: Negative.   Musculoskeletal: Negative.   Skin: Negative.   Allergic/Immunologic: Negative.   Neurological: Negative.   Hematological: Negative.   Psychiatric/Behavioral: Negative.     Outpatient Prescriptions Prior to Visit  Medication  Sig Dispense Refill  . ofloxacin (OCUFLOX) 0.3 % ophthalmic solution       . predniSONE (DELTASONE) 10 MG tablet Take 1 tablet (10 mg total) by mouth daily. Take as directed.  48 tablet  0   Facility-Administered Medications Prior to Visit  Medication Dose Route Frequency Provider Last Rate Last Dose  . methylPREDNISolone acetate (DEPO-MEDROL) injection 80 mg  80 mg Intramuscular Once Baker Pierini, FNP       Allergies  Allergen Reactions  . Alendronate Sodium     REACTION: GI problems  . Risedronate Sodium     REACTION: GI problems   Patient Active Problem List  Diagnosis  . OSTEOPENIA  . GERD  . INSOMNIA-SLEEP DISORDER-UNSPEC  . Diverticulosis of colon without hemorrhage  . Family hx of colon cancer   History  Substance Use Topics  . Smoking status: Never Smoker   . Smokeless tobacco: Never Used  . Alcohol Use: Yes   family history includes Colon cancer in her father and Heart disease in her mother.      Objective:   Physical Exam O. well-developed older white female in no acute distress, pleasant blood pressure 124/82 pulse 76 height 5 foot 3 weight 138. HEENT; nontraumatic normocephalic EOMI PERRLA sclera anicteric,Neck; Supple no JVD, Cardiovascular; regular rate and rhythm with S1-S2 no murmur or gallop, Pulmonary; clear bilaterally, Abdomen; soft nontender nondistended bowel sounds are active there is no palpable mass or hepatosplenomegaly, Rectal; exam tiny external hemorrhoidal tag on anoscopy she does  have an internal hemorrhoid with an erosion and friability this is nontender and soft. Stool is brown and trace heme positive, Extremities ;no clubbing cyanosis or edema skin warm and dry, Psych; mood and affect normal and appropriate.        Assessment & Plan:  #40 72 year old female with intermittent hematochezia, painless. This is most likely secondary to friable internal hemorrhoid noted on anoscopy. Cannot absolutely rule out occult colon lesion though  negative colonoscopy 3-1/2 years ago. Also cannot rule out diverticular bleeding though less likely.  #2 diverticulosis #3 positive family history of colon cancer in patient's father  Plan; we'll start Anusol-HC suppositories at bedtime x10-14 days then as needed. Patient had a CBC done today which is normal. We discussed followup colonoscopy, she will be due in 2015-however if she continues to have intermittent bleeding over the next couple of months despite treatment for the internal hemorrhoid would proceed with colonoscopy. She will call back to schedule with Dr. Juanda Chance  if her bleeding persists

## 2012-04-30 NOTE — Patient Instructions (Addendum)
We sent a prescription for Rectal suppositories to [facility].   Call to schedule a colonoscopy with Dr. Juanda Chance if you continue to have bleeding .

## 2012-07-20 ENCOUNTER — Telehealth: Payer: Self-pay | Admitting: Physician Assistant

## 2012-07-20 NOTE — Telephone Encounter (Signed)
Left a message for patient to call me. 

## 2012-07-20 NOTE — Telephone Encounter (Signed)
Patient states she saw Mike Gip, PA in April for bleeding hemorrhoid. She got better until yesterday. She states the suppositories help but she thinks she was suppose to call us. Per Mike Gip, PA note, patient should she Dr. Juanda Chance if bleeding recurrs. Scheduled on 07/27/12 at 2:30 PM.

## 2012-07-27 ENCOUNTER — Encounter: Payer: Self-pay | Admitting: Internal Medicine

## 2012-07-27 ENCOUNTER — Ambulatory Visit (INDEPENDENT_AMBULATORY_CARE_PROVIDER_SITE_OTHER): Payer: Medicare Other | Admitting: Internal Medicine

## 2012-07-27 VITALS — BP 110/70 | HR 72 | Ht 63.5 in | Wt 137.5 lb

## 2012-07-27 DIAGNOSIS — K625 Hemorrhage of anus and rectum: Secondary | ICD-10-CM

## 2012-07-27 DIAGNOSIS — K648 Other hemorrhoids: Secondary | ICD-10-CM

## 2012-07-27 MED ORDER — HYDROCORTISONE ACETATE 25 MG RE SUPP: 25.0000 mg | Freq: Every day | RECTAL | Status: DC

## 2012-07-27 NOTE — Progress Notes (Signed)
Ruth Wilson 12-19-1940 MRN [id number]        History of Present Illness:  This is a 72 year old white female with symptomatic first grade hemorrhoids. Initial appointment in April 2014. She was treated with Anusol-HC suppositories and became asymptomatic. Last week she had an episode of recurrent rectal bleeding and use the suppositories again for several days with complete resolution. Bowel habits are normal. Last colonoscopy in December 2010 showed moderately severe diverticulosis but no polyps. There is a family history of colon cancer in her father   Past Medical History  Diagnosis Date  . Diverticulosis    Past Surgical History  Procedure Laterality Date  . Abdominal hysterectomy    . Oophorectomy      reports that she has never smoked. She has never used smokeless tobacco. She reports that  drinks alcohol. She reports that she does not use illicit drugs. family history includes Colon cancer in her father and Heart disease in her mother. Allergies  Allergen Reactions  . Alendronate Sodium     REACTION: GI problems  . Risedronate Sodium     REACTION: GI problems        Review of Systems: Denies abdominal pain chest pain shortness of breath  The remainder of the 10 point ROS is negative except as outlined in H&P   Physical Exam: General appearance  Well developed, in no distress. Eyes- non icteric. HEENT nontraumatic, normocephalic. Mouth no lesions, tongue papillated, no cheilosis. Neck supple without adenopathy, thyroid not enlarged, no carotid bruits, no JVD. Lungs Clear to auscultation bilaterally. Cor normal S1, normal S2, regular rhythm, no murmur,  quiet precordium. Abdomen: Soft nontender Rectal: Small external hemorrhoidal tags. Normal rectal sphincter tone. Small internal hemorrhoids palpated stool Hemoccult negative. No discomfort no fissure Extremities no pedal edema. Skin no lesions. Neurological alert and oriented x 3. Psychological normal  mood and affect.  Assessment and Plan:  72 year old white female with symptomatic first-grade hemorrhoids which are responsive to conservative treatment. She prefers to continue the current treatment with Anusol-HC suppositories. She will be due for recall colonoscopy in December 2015 and if, at that point, she decides to go with hemorrhoidal band ligation we may do that at the same time   07/27/2012 Ruth Wilson

## 2012-07-27 NOTE — Patient Instructions (Addendum)
We have sent the following medications to your pharmacy for you to pick up at your convenience: Anusol [facility]  You will be due for a recall colonoscopy in 12/2013. We will send you a reminder in the mail when it gets closer to that time.  CC: Dr Cato Mulligan

## 2012-10-29 ENCOUNTER — Ambulatory Visit (INDEPENDENT_AMBULATORY_CARE_PROVIDER_SITE_OTHER): Payer: Medicare Other | Admitting: Internal Medicine

## 2012-10-29 DIAGNOSIS — Z23 Encounter for immunization: Secondary | ICD-10-CM

## 2012-11-23 ENCOUNTER — Other Ambulatory Visit: Payer: Self-pay | Admitting: Internal Medicine

## 2012-11-23 DIAGNOSIS — Z1231 Encounter for screening mammogram for malignant neoplasm of breast: Secondary | ICD-10-CM

## 2012-12-17 ENCOUNTER — Ambulatory Visit (HOSPITAL_COMMUNITY)
Admission: RE | Admit: 2012-12-17 | Discharge: 2012-12-17 | Disposition: A | Discharge status: Home / Self Care | Payer: Medicare Other | Source: Ambulatory Visit | Attending: Internal Medicine | Admitting: Internal Medicine

## 2012-12-17 DIAGNOSIS — Z1231 Encounter for screening mammogram for malignant neoplasm of breast: Secondary | ICD-10-CM

## 2013-08-23 ENCOUNTER — Telehealth: Payer: Self-pay | Admitting: Internal Medicine

## 2013-08-23 NOTE — Telephone Encounter (Signed)
lmovm to c/b and est with Yong Channel

## 2013-09-02 DIAGNOSIS — L570 Actinic keratosis: Secondary | ICD-10-CM | POA: Diagnosis not present

## 2013-09-02 DIAGNOSIS — D235 Other benign neoplasm of skin of trunk: Secondary | ICD-10-CM | POA: Diagnosis not present

## 2013-09-02 DIAGNOSIS — D2360 Other benign neoplasm of skin of unspecified upper limb, including shoulder: Secondary | ICD-10-CM | POA: Diagnosis not present

## 2013-09-02 DIAGNOSIS — D485 Neoplasm of uncertain behavior of skin: Secondary | ICD-10-CM | POA: Diagnosis not present

## 2013-09-02 DIAGNOSIS — L821 Other seborrheic keratosis: Secondary | ICD-10-CM | POA: Diagnosis not present

## 2013-09-12 ENCOUNTER — Ambulatory Visit: Payer: Medicare Other | Admitting: *Deleted

## 2013-09-13 ENCOUNTER — Encounter: Payer: Self-pay | Admitting: Internal Medicine

## 2013-09-18 ENCOUNTER — Encounter: Payer: Self-pay | Admitting: Internal Medicine

## 2013-09-30 ENCOUNTER — Ambulatory Visit (INDEPENDENT_AMBULATORY_CARE_PROVIDER_SITE_OTHER): Payer: Medicare Other | Admitting: Family Medicine

## 2013-09-30 ENCOUNTER — Encounter: Payer: Self-pay | Admitting: Family Medicine

## 2013-09-30 VITALS — BP 110/80 | HR 72 | Temp 97.4°F | Wt 141.0 lb

## 2013-09-30 DIAGNOSIS — K648 Other hemorrhoids: Secondary | ICD-10-CM

## 2013-09-30 DIAGNOSIS — M949 Disorder of cartilage, unspecified: Secondary | ICD-10-CM | POA: Diagnosis not present

## 2013-09-30 DIAGNOSIS — K649 Unspecified hemorrhoids: Secondary | ICD-10-CM

## 2013-09-30 DIAGNOSIS — E785 Hyperlipidemia, unspecified: Secondary | ICD-10-CM

## 2013-09-30 DIAGNOSIS — IMO0001 Reserved for inherently not codable concepts without codable children: Secondary | ICD-10-CM

## 2013-09-30 DIAGNOSIS — J3081 Allergic rhinitis due to animal (cat) (dog) hair and dander: Secondary | ICD-10-CM

## 2013-09-30 DIAGNOSIS — G4700 Insomnia, unspecified: Secondary | ICD-10-CM

## 2013-09-30 DIAGNOSIS — M899 Disorder of bone, unspecified: Secondary | ICD-10-CM

## 2013-09-30 DIAGNOSIS — Z800 Family history of malignant neoplasm of digestive organs: Secondary | ICD-10-CM

## 2013-09-30 DIAGNOSIS — Z0000 Encounter for general adult medical examination without abnormal findings: Secondary | ICD-10-CM | POA: Diagnosis not present

## 2013-09-30 MED ORDER — ALBUTEROL SULFATE HFA 108 (90 BASE) MCG/ACT IN AERS: 2.0000 | INHALATION_SPRAY | Freq: Four times a day (QID) | RESPIRATORY_TRACT | Status: DC | PRN

## 2013-09-30 NOTE — Assessment & Plan Note (Signed)
Patient with episode of bleeding that responded to hydrocortisone suppository. She is known to have internal hemorrhoids. She is due for colon cancer screening and I gave her a copy of letter that was sent by GI.

## 2013-09-30 NOTE — Progress Notes (Signed)
Ruth Reddish, MD Phone: [phone]  Subjective:  Patient presents today to establish care with me as their new primary care provider and for annual physical. Patient was formerly a patient of Dr. Leanne Chang. Chief complaint-noted.   Hemorrhoids Used hydrocortisone suppository about a month ago after had bleed lasting 2-3 days. Has to wear pads when this occurs. Wants to know when next colonoscopy will be.  ROS-some recent cold, congestion. No fatigue. No melena.  Family history of colon cancer noted.   The following were reviewed and entered/updated in epic: Past Medical History  Diagnosis Date  . Diverticulosis of colon without hemorrhage 04/30/2012   Patient Active Problem List   Diagnosis Date Noted  . Hemorrhoids 09/30/2013    Priority: Low  . Allergy to cats 09/30/2013    Priority: Low  . Family hx of colon cancer 04/30/2012    Priority: Low  . GERD 02/09/2010    Priority: Low  . INSOMNIA-SLEEP DISORDER-UNSPEC 02/09/2010    Priority: Low  . OSTEOPENIA 10/17/2006    Priority: Low   Past Surgical History  Procedure Laterality Date  . Abdominal hysterectomy    . Oophorectomy    . Cataract surgery      tattoo on eye due to damaged iris    Family History  Problem Relation Age of Onset  . Heart disease Mother     MI age 32 but lived to 64  . Colon cancer Father     died 91 from colon cancer    Medications- reviewed and updated Current Outpatient Prescriptions  Medication Sig Dispense Refill  . Coenzyme Q10 (CO Q 10 PO) Take 300 mg by mouth.      . Cyanocobalamin (VITAMIN B-12) 1000 MCG SUBL Place 1,000 mcg under the tongue.      Marland Kitchen albuterol (PROVENTIL HFA;VENTOLIN HFA) 108 (90 BASE) MCG/ACT inhaler Inhale 2 puffs into the lungs every 6 (six) hours as needed for wheezing or shortness of breath.  1 Inhaler  0  . hydrocortisone (ANUSOL-HC) 25 MG suppository Place 1 suppository (25 mg total) rectally at bedtime. Use 1 suppository at bedtime for 10 days then as needed.   12 suppository  0  . Ibuprofen-Diphenhydramine Cit (ADVIL PM PO) Take by mouth.       No current facility-administered medications for this visit.    Allergies-reviewed and updated Allergies  Allergen Reactions  . Alendronate Sodium     REACTION: GI problems  . Risedronate Sodium     REACTION: GI problems    History   Social History  . Marital Status: Married    Spouse Name: N/A    Number of Children: 3  . Years of Education: N/A   Occupational History  . Retired    Social History Main Topics  . Smoking status: Never Smoker   . Smokeless tobacco: Never Used  . Alcohol Use: 7.0 oz/week    14 drink(s) per week     Comment: advised to cut to 1 a day  . Drug Use: No  . Sexual Activity: None   Other Topics Concern  . None   Social History Narrative   Married 51 years in 2015. 3 children. Stacy lives in [place] middle divorced with 3 boys, Clair Gulling oldest living in [place] has 6 children and is a Restaurant manager, fast food, Gershon Mussel lives in [place] no children but 4 dogs.       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   Objective: BP 110/80  Pulse 72  Temp(Src) 97.4 F (36.3 C)  Wt 141 lb (63.957 kg) Gen: NAD, resting comfortably HEENT: Mucous membranes are moist. Oropharynx normal. Good dentition Eye: eye tattoo noted on left eye lateral margin Neck: no thyromegaly CV: RRR no murmurs rubs or gallops Lungs: CTAB no crackles, wheeze, rhonchi Abdomen: soft/nontender/nondistended/normal bowel sounds. No rebound or guarding.  Ext: no edema, 2+ DP, PT, radial pulses Skin: warm, dry, no rash Neuro: grossly normal, moves all extremities, PERRLA  Assessment/Plan:  Hemorrhoids Patient with episode of bleeding that responded to hydrocortisone suppository. She is known to have internal hemorrhoids. She is due for colon cancer screening and I gave her a copy of letter that was  sent by GI.    74 y.o. female presenting for annual physical.  Health Maintenance counseling: 1. Anticipatory guidance: Patient counseled regarding regular dental exams, wearing seatbelts.  2. Risk factor reduction:  Advised patient of need for regular exercise and diet rich and fruits and vegetables to reduce risk of heart attack and stroke.  3. Immunizations/screenings/ancillary studies Health Maintenance Due  Topic Date Due  . Influenza Vaccine -will return for high dose 08/10/2013   Return for fasting labs Orders Placed This Encounter  Procedures  . CBC    [place]    Standing Status: Future     Number of Occurrences:      Standing Expiration Date: 11/09/2013  . Comprehensive metabolic panel    [place]    Standing Status: Future     Number of Occurrences:      Standing Expiration Date: 11/09/2013  . Lipid panel    [place]    Standing Status: Future     Number of Occurrences:      Standing Expiration Date: 11/09/2013  . TSH        Standing Status: Future     Number of Occurrences:      Standing Expiration Date: 11/09/2013    Meds ordered this encounter  Medications  . albuterol (PROVENTIL HFA;VENTOLIN HFA) 108 (90 BASE) MCG/ACT inhaler    Sig: Inhale 2 puffs into the lungs every 6 (six) hours as needed for wheezing or shortness of breath.    Dispense:  1 Inhaler    Refill:  0

## 2013-09-30 NOTE — Patient Instructions (Signed)
Come back for fasting labs.   Refilled albuterol for cat allergy.   Call in about 6 weeks if you have not heard about high dose flu vaccine.   73 y.o. female presenting for annual physical.  Health Maintenance counseling: 1. Anticipatory guidance: Patient counseled regarding regular dental exams, wearing seatbelts.  2. Risk factor reduction:  Advised patient of need for regular exercise and diet rich and fruits and vegetables to reduce risk of heart attack and stroke.  3. Immunizations/screenings/ancillary studies Health Maintenance Due  Topic Date Due  . Influenza Vaccine  08/10/2013

## 2013-10-02 ENCOUNTER — Ambulatory Visit (INDEPENDENT_AMBULATORY_CARE_PROVIDER_SITE_OTHER): Payer: Medicare Other

## 2013-10-02 DIAGNOSIS — Z23 Encounter for immunization: Secondary | ICD-10-CM | POA: Diagnosis not present

## 2013-10-10 ENCOUNTER — Encounter: Payer: Self-pay | Admitting: Internal Medicine

## 2013-10-16 ENCOUNTER — Other Ambulatory Visit (INDEPENDENT_AMBULATORY_CARE_PROVIDER_SITE_OTHER): Payer: Medicare Other

## 2013-10-16 DIAGNOSIS — K648 Other hemorrhoids: Secondary | ICD-10-CM | POA: Diagnosis not present

## 2013-10-16 DIAGNOSIS — Z0000 Encounter for general adult medical examination without abnormal findings: Secondary | ICD-10-CM | POA: Diagnosis not present

## 2013-10-16 DIAGNOSIS — E785 Hyperlipidemia, unspecified: Secondary | ICD-10-CM | POA: Diagnosis not present

## 2013-10-16 DIAGNOSIS — IMO0001 Reserved for inherently not codable concepts without codable children: Secondary | ICD-10-CM

## 2013-10-16 LAB — COMPREHENSIVE METABOLIC PANEL
ALT: 9 U/L (ref 0–35)
AST: 17 U/L (ref 0–37)
Albumin: 3.8 g/dL (ref 3.5–5.2)
Alkaline Phosphatase: 55 U/L (ref 39–117)
BUN: 9 mg/dL (ref 6–23)
CALCIUM: 9.4 mg/dL (ref 8.4–10.5)
CHLORIDE: 101 meq/L (ref 96–112)
CO2: 28 mEq/L (ref 19–32)
Creatinine, Ser: 0.6 mg/dL (ref 0.4–1.2)
GFR: 102.43 mL/min (ref >60.00)
GLUCOSE: 93 mg/dL (ref 70–99)
Potassium: 4.2 mEq/L (ref 3.5–5.1)
SODIUM: 136 meq/L (ref 135–145)
Total Bilirubin: 0.9 mg/dL (ref 0.2–1.2)
Total Protein: 7.3 g/dL (ref 6.0–8.3)

## 2013-10-16 LAB — LIPID PANEL
CHOL/HDL RATIO: 2
CHOLESTEROL: 169 mg/dL (ref 0–200)
HDL: 77.4 mg/dL (ref >39.00)
LDL CALC: 79 mg/dL (ref 0–99)
NonHDL: 91.6
TRIGLYCERIDES: 63 mg/dL (ref 0.0–149.0)
VLDL: 12.6 mg/dL (ref 0.0–40.0)

## 2013-10-16 LAB — CBC
HCT: 39 % (ref 36.0–46.0)
Hemoglobin: 12.8 g/dL (ref 12.0–15.0)
MCHC: 32.7 g/dL (ref 30.0–36.0)
MCV: 92.5 fl (ref 78.0–100.0)
PLATELETS: 278 10*3/uL (ref 150.0–400.0)
RBC: 4.21 Mil/uL (ref 3.87–5.11)
RDW: 13.4 % (ref 11.5–15.5)
WBC: 4 10*3/uL (ref 4.0–10.5)

## 2013-10-16 LAB — TSH: TSH: 1.14 u[IU]/mL (ref 0.35–4.50)

## 2013-11-22 ENCOUNTER — Other Ambulatory Visit: Payer: Self-pay | Admitting: Family Medicine

## 2013-11-22 DIAGNOSIS — Z1231 Encounter for screening mammogram for malignant neoplasm of breast: Secondary | ICD-10-CM

## 2013-12-02 ENCOUNTER — Ambulatory Visit (AMBULATORY_SURGERY_CENTER): Payer: Self-pay | Admitting: *Deleted

## 2013-12-02 VITALS — Ht 63.5 in | Wt 142.8 lb

## 2013-12-02 DIAGNOSIS — Z86010 Personal history of colonic polyps: Secondary | ICD-10-CM

## 2013-12-02 MED ORDER — MOVIPREP 100 G PO SOLR: 1.0000 | Freq: Once | ORAL | Status: DC

## 2013-12-02 NOTE — Progress Notes (Signed)
No egg or soy allergy. ewm No issues with past sedation. ewm No diet pills, no blood thinners. ewm No home 02 use. ewm No emmi video. ewm

## 2013-12-18 ENCOUNTER — Ambulatory Visit (HOSPITAL_COMMUNITY): Payer: Medicare Other

## 2013-12-18 ENCOUNTER — Encounter: Payer: Medicare Other | Admitting: Internal Medicine

## 2013-12-20 ENCOUNTER — Ambulatory Visit (HOSPITAL_COMMUNITY)
Admission: RE | Admit: 2013-12-20 | Discharge: 2013-12-20 | Disposition: A | Discharge status: Home / Self Care | Payer: Medicare Other | Source: Ambulatory Visit | Attending: Family Medicine | Admitting: Family Medicine

## 2013-12-20 DIAGNOSIS — Z1231 Encounter for screening mammogram for malignant neoplasm of breast: Secondary | ICD-10-CM | POA: Diagnosis not present

## 2014-01-29 ENCOUNTER — Telehealth: Payer: Self-pay | Admitting: Internal Medicine

## 2014-01-29 NOTE — Telephone Encounter (Signed)
No charge. 

## 2014-01-31 ENCOUNTER — Encounter: Payer: Medicare Other | Admitting: Internal Medicine

## 2014-02-24 ENCOUNTER — Ambulatory Visit: Payer: Medicare Other | Admitting: Family Medicine

## 2014-03-12 ENCOUNTER — Encounter: Payer: Self-pay | Admitting: Internal Medicine

## 2014-03-12 ENCOUNTER — Ambulatory Visit (AMBULATORY_SURGERY_CENTER): Payer: Medicare HMO | Admitting: Internal Medicine

## 2014-03-12 VITALS — BP 121/73 | HR 60 | Temp 96.1°F | Resp 16 | Ht 63.5 in | Wt 142.0 lb

## 2014-03-12 DIAGNOSIS — Z800 Family history of malignant neoplasm of digestive organs: Secondary | ICD-10-CM

## 2014-03-12 DIAGNOSIS — Z86010 Personal history of colonic polyps: Secondary | ICD-10-CM

## 2014-03-12 DIAGNOSIS — Z1211 Encounter for screening for malignant neoplasm of colon: Secondary | ICD-10-CM

## 2014-03-12 MED ORDER — SODIUM CHLORIDE 0.9 % IV SOLN: 500.0000 mL | INTRAVENOUS | Status: DC

## 2014-03-12 NOTE — Op Note (Addendum)
[place]  [address] [place], [address]   COLONOSCOPY PROCEDURE REPORT  PATIENT: Ruth Wilson, Ruth Wilson  MR#: [id number] BIRTHDATE: 1940/02/13 , 73  yrs. old GENDER: female ENDOSCOPIST: Lafayette Dragon, MD REFERRED BY:Dr Stphen Hunter PROCEDURE DATE:  03/12/2014 PROCEDURE:   Colonoscopy, screening First Screening Colonoscopy - Avg.  risk and is 50 yrs.  old or older - No.  Prior Negative Screening - Now for repeat screening. Less than 10 yrs Prior Negative Screening - Now for repeat screening.  Above average risk  History of Adenoma - Now for follow-up colonoscopy & has been > or = to 3 yrs.  N/A  Polyps Removed Today? No.  Polyps Removed Today? No.  Recommend repeat exam, <10 yrs? Polyps Removed Today? No.  Recommend repeat exam, <10 yrs? Yes. ASA CLASS:   Class II INDICATIONS:colon Cancer in patient's father.  Prior colonoscopy in 1990, 1995,2000, 2005 and in December 2010. MEDICATIONS: Monitored anesthesia care and Propofol 300 mg IV  DESCRIPTION OF PROCEDURE:   After the risks benefits and alternatives of the procedure were thoroughly explained, informed consent was obtained.  The digital rectal exam revealed no abnormalities of the rectum.   The LB PCF Q180 [id number]  endoscope was introduced through the anus and advanced to the cecum, which was identified by both the appendix and ileocecal valve. No adverse events experienced.   The quality of the prep was good, using MoviPrep  The instrument was then slowly withdrawn as the colon was fully examined.      COLON FINDINGS: There was mild diverticulosis noted in the sigmoid colon.  Retroflexed views revealed no abnormalities. The time to cecum=7 minutes 27 seconds.  Withdrawal time=5 minutes 16 seconds. The scope was withdrawn and the procedure completed. COMPLICATIONS: There were no immediate complications.  ENDOSCOPIC IMPRESSION: Mild diverticulosis was noted in the sigmoid colon internal  hemorrhoids  RECOMMENDATIONS: High fiber diet Recall colonoscopy in 5 years Preparation H supp prn  hemorrhoids  eSigned:  Lafayette Dragon, MD 03/12/2014 11:31 AM Revised: 03/12/2014 11:31 AM  cc:   PATIENT NAME:  [place], Barcelo MR#: [id number]

## 2014-03-12 NOTE — Patient Instructions (Signed)
YOU HAD AN ENDOSCOPIC PROCEDURE TODAY AT [facility]:   Refer to the procedure report that was given to you for any specific questions about what was found during the examination.  If the procedure report does not answer your questions, please call your gastroenterologist to clarify.  If you requested that your care partner not be given the details of your procedure findings, then the procedure report has been included in a sealed envelope for you to review at your convenience later.  YOU SHOULD EXPECT: Some feelings of bloating in the abdomen. Passage of more gas than usual.  Walking can help get rid of the air that was put into your GI tract during the procedure and reduce the bloating. If you had a lower endoscopy (such as a colonoscopy or flexible sigmoidoscopy) you may notice spotting of blood in your stool or on the toilet paper. If you underwent a bowel prep for your procedure, you may not have a normal bowel movement for a few days.  Please Note:  You might notice some irritation and congestion in your nose or some drainage.  This is from the oxygen used during your procedure.  There is no need for concern and it should clear up in a day or so.  SYMPTOMS TO REPORT IMMEDIATELY:   Following lower endoscopy (colonoscopy or flexible sigmoidoscopy):  Excessive amounts of blood in the stool  Significant tenderness or worsening of abdominal pains  Swelling of the abdomen that is new, acute  Fever of 100F or higher   A gastroenterologist can be reached at any hour by calling [phone].   DIET: Your first meal following the procedure should be a small meal and then it is ok to progress to your normal diet. Heavy or fried foods are harder to digest and may make you feel nauseous or bloated.  Likewise, meals heavy in dairy and vegetables can increase bloating.  Drink plenty of fluids but you should avoid alcoholic beverages for 24 hours.  ACTIVITY:  You should plan to take it  easy for the rest of today and you should NOT DRIVE or use heavy machinery until tomorrow (because of the sedation medicines used during the test).    FOLLOW UP: Our staff will call the number listed on your records the next business day following your procedure to check on you and address any questions or concerns that you may have regarding the information given to you following your procedure. If we do not reach you, we will leave a message.  However, if you are feeling well and you are not experiencing any problems, there is no need to return our call.  We will assume that you have returned to your regular daily activities without incident.  If any biopsies were taken you will be contacted by phone or by letter within the next 1-3 weeks.  Please call us at [phone] if you have not heard about the biopsies in 3 weeks.    SIGNATURES/CONFIDENTIALITY: You and/or your care partner have signed paperwork which will be entered into your electronic medical record.  These signatures attest to the fact that that the information above on your After Visit Summary has been reviewed and is understood.  Full responsibility of the confidentiality of this discharge information lies with you and/or your care-partner.  Diverticulosis, high fiber diet-handouts given  Repeat colonoscopy in 5 years-2021.

## 2014-03-12 NOTE — Progress Notes (Signed)
Patient awakening vss, report to rn 

## 2014-03-13 ENCOUNTER — Telehealth: Payer: Self-pay

## 2014-03-13 NOTE — Telephone Encounter (Signed)
Left message on answering machine. 

## 2014-07-07 ENCOUNTER — Other Ambulatory Visit: Payer: Self-pay

## 2014-09-29 ENCOUNTER — Ambulatory Visit (INDEPENDENT_AMBULATORY_CARE_PROVIDER_SITE_OTHER): Payer: Medicare HMO | Admitting: Family Medicine

## 2014-09-29 DIAGNOSIS — Z23 Encounter for immunization: Secondary | ICD-10-CM | POA: Diagnosis not present

## 2014-11-17 ENCOUNTER — Other Ambulatory Visit: Payer: Self-pay

## 2014-11-17 DIAGNOSIS — Z1231 Encounter for screening mammogram for malignant neoplasm of breast: Secondary | ICD-10-CM

## 2014-12-23 ENCOUNTER — Ambulatory Visit (INDEPENDENT_AMBULATORY_CARE_PROVIDER_SITE_OTHER): Payer: Medicare HMO | Admitting: Family Medicine

## 2014-12-23 ENCOUNTER — Ambulatory Visit
Admission: RE | Admit: 2014-12-23 | Discharge: 2014-12-23 | Disposition: A | Discharge status: Home / Self Care | Payer: Medicare HMO | Source: Ambulatory Visit

## 2014-12-23 DIAGNOSIS — Z23 Encounter for immunization: Secondary | ICD-10-CM

## 2014-12-23 DIAGNOSIS — Z1231 Encounter for screening mammogram for malignant neoplasm of breast: Secondary | ICD-10-CM

## 2015-04-16 ENCOUNTER — Telehealth: Payer: Self-pay | Admitting: Family Medicine

## 2015-04-16 NOTE — Telephone Encounter (Signed)
Dr. Yong Channel aware appt made to discuss.

## 2015-04-16 NOTE — Telephone Encounter (Signed)
Patient Name: TIERRA RIEBELING DOB: 1940/04/25 Initial Comment Caller states wants talk to triage nurse about getting insurance company to cover wife's chiropractor visit. Nurse Assessment Nurse: Ronnald Ramp, RN, Miranda Date/Time (Eastern Time): 04/16/2015 10:57:59 AM Confirm and document reason for call. If symptomatic, describe symptoms. You must click the next button to save text entered. ---Caller states his wife has chronic neck, back, and hip pain, She has been seeing a Restaurant manager, fast food but just found out the insurance will only cover it if they have a doctor's order. She has not discussed this with her MD in the past. She just left the chiropractor so she is feeling fine right now. Has the patient traveled out of the country within the last 30 days? ---Not Applicable Does the patient have any new or worsening symptoms? ---Yes Will a triage be completed? ---Yes Related visit to physician within the last 2 weeks? ---No Does the PT have any chronic conditions? (i.e. diabetes, asthma, etc.) ---No Is this a behavioral health or substance abuse call? ---No Guidelines Guideline Title Affirmed Question Affirmed Notes Neck Pain or Stiffness Neck pain is a chronic symptom (recurrent or ongoing AND present > 4 weeks) Final Disposition User See PCP within 2 Ronny Flurry, RN, Miranda Comments Appt scheduled with Dr. Yong Channel on April 20th at 11:15am Disagree/Comply: Comply

## 2015-04-30 ENCOUNTER — Encounter: Payer: Self-pay | Admitting: Family Medicine

## 2015-04-30 ENCOUNTER — Ambulatory Visit (INDEPENDENT_AMBULATORY_CARE_PROVIDER_SITE_OTHER)
Admission: RE | Admit: 2015-04-30 | Discharge: 2015-04-30 | Disposition: A | Discharge status: Home / Self Care | Payer: Medicare HMO | Source: Ambulatory Visit | Attending: Family Medicine | Admitting: Family Medicine

## 2015-04-30 ENCOUNTER — Ambulatory Visit (INDEPENDENT_AMBULATORY_CARE_PROVIDER_SITE_OTHER): Payer: Medicare HMO | Admitting: Family Medicine

## 2015-04-30 VITALS — BP 142/82 | HR 69 | Temp 97.9°F | Wt 144.0 lb

## 2015-04-30 DIAGNOSIS — Z780 Asymptomatic menopausal state: Secondary | ICD-10-CM

## 2015-04-30 DIAGNOSIS — M25551 Pain in right hip: Secondary | ICD-10-CM | POA: Insufficient documentation

## 2015-04-30 DIAGNOSIS — M25552 Pain in left hip: Secondary | ICD-10-CM | POA: Diagnosis not present

## 2015-04-30 DIAGNOSIS — M8580 Other specified disorders of bone density and structure, unspecified site: Secondary | ICD-10-CM | POA: Diagnosis not present

## 2015-04-30 DIAGNOSIS — Z0000 Encounter for general adult medical examination without abnormal findings: Secondary | ICD-10-CM

## 2015-04-30 DIAGNOSIS — Z0001 Encounter for general adult medical examination with abnormal findings: Secondary | ICD-10-CM

## 2015-04-30 NOTE — Progress Notes (Signed)
Ruth Reddish, MD Phone: [phone]  Subjective:  Patient presents today for their annual wellness visit.    Preventive Screening-Counseling & Management  Smoking Status: Never Smoker Second Hand Smoking status: No smokers in home  Risk Factors Regular exercise: 5-6 days a week at [facility] , weights and cardio Diet: reasonable, weight stable  Wt Readings from Last 3 Encounters:  04/30/15 144 lb (65.318 kg)  03/12/14 142 lb (64.411 kg)  12/02/13 142 lb 12.8 oz (64.774 kg)  Fall Risk: None   Cardiac risk factors:  advanced age (older than 22 for men, 25 for women)  Hyperlipidemia - well controlled without medication most recently Lab Results  Component Value Date   CHOL 169 10/16/2013   HDL 77.40 10/16/2013   LDLCALC 79 10/16/2013   LDLDIRECT 105.8 01/28/2010   TRIG 63.0 10/16/2013   CHOLHDL 2 10/16/2013  No diabetes.  No history of hypertension, slightly elevated today  Depression Screen None. PHQ2 0   Activities of Daily Living Independent ADLs and IADLs   Hearing Difficulties: -patient declines  Cognitive Testing No reported trouble.   Normal 3 word recall  List the Names of Other Physician/Practitioners you currently use: -Dr. Olevia Perches in past for colonoscopy -Dr. Thayer Jew at [facility] for eyes  Immunization History  Administered Date(s) Administered  . Influenza Split 10/12/2010, 10/26/2011  . Influenza Whole 10/11/2006, 10/31/2007, 12/24/2008, 10/13/2009  . Influenza, High Dose Seasonal PF 10/29/2012, 10/02/2013  . Influenza,inj,Quad PF,36+ Mos 09/29/2014  . Pneumococcal Conjugate-13 12/23/2014  . Pneumococcal Polysaccharide-23 01/16/2007  . Td 01/16/2007   Required Immunizations needed today none.   Screening tests- up to date Health Maintenance Due  Topic Date Due  . DEXA SCAN - schedule at check out. 2012 with osteopenia 08/18/2006   ROS- No pertinent positives discovered in course of AWV other than chronic hip, back,, neck pain  that she sees a chiropractor for and would like referral. Very reasonable as provides relief.  No leg weakness, no fecal or urinary incontinence.   The following were reviewed and entered/updated in epic: Past Medical History  Diagnosis Date  . Diverticulosis of colon without hemorrhage 04/30/2012  . Allergy     cat dander and seasonal  . Cataract   . Osteopenia    Patient Active Problem List   Diagnosis Date Noted  . Hyperlipidemia 09/30/2013    Priority: Medium  . Hemorrhoids 09/30/2013    Priority: Low  . Allergy to cats 09/30/2013    Priority: Low  . Family hx of colon cancer 04/30/2012    Priority: Low  . GERD 02/09/2010    Priority: Low  . INSOMNIA-SLEEP DISORDER-UNSPEC 02/09/2010    Priority: Low  . Osteopenia 10/17/2006    Priority: Low  . Hip pain, bilateral 04/30/2015   Past Surgical History  Procedure Laterality Date  . Abdominal hysterectomy    . Oophorectomy    . Cataract surgery      tattoo on eye due to damaged iris  . Colonoscopy    . Polypectomy    . Tonsillectomy      Family History  Problem Relation Age of Onset  . Heart disease Mother     MI age 63 but lived to 36  . Colon cancer Father     died 28 from colon cancer    Medications- reviewed and updated Current Outpatient Prescriptions  Medication Sig Dispense Refill  . Cholecalciferol (VITAMIN D-3 PO) Take 2,000 Units by mouth daily.    . Coenzyme Q10 (CO Q 10  PO) Take 300 mg by mouth.    . Cyanocobalamin (VITAMIN B-12) 1000 MCG SUBL Place 1,000 mcg under the tongue.    . Ibuprofen-Diphenhydramine Cit (ADVIL PM PO) Take by mouth.    Marland Kitchen albuterol (PROVENTIL HFA;VENTOLIN HFA) 108 (90 BASE) MCG/ACT inhaler Inhale 2 puffs into the lungs every 6 (six) hours as needed for wheezing or shortness of breath. (Patient not taking: Reported on 04/30/2015) 1 Inhaler 0  . hydrocortisone (ANUSOL-HC) 25 MG suppository Place 1 suppository (25 mg total) rectally at bedtime. Use 1 suppository at bedtime for 10  days then as needed. (Patient not taking: Reported on 04/30/2015) 12 suppository 0   No current facility-administered medications for this visit.    Allergies-reviewed and updated Allergies  Allergen Reactions  . Alendronate Sodium     REACTION: GI problems fosamax  . Risedronate Sodium     REACTION: GI problems boniva    Social History   Social History  . Marital Status: Married    Spouse Name: N/A  . Number of Children: 3  . Years of Education: N/A   Occupational History  . Retired    Social History Main Topics  . Smoking status: Never Smoker   . Smokeless tobacco: Never Used  . Alcohol Use: 8.4 oz/week    14 Standard drinks or equivalent per week     Comment: advised to cut to 1 a day-red wine  . Drug Use: No  . Sexual Activity: Not Asked   Other Topics Concern  . None   Social History Narrative   Married 51 years in 2015. 3 children. Stacy lives in [place] middle divorced with 3 boys, Clair Gulling oldest living in [place] has 6 children and is a Restaurant manager, fast food, Gershon Mussel lives in [place] no children but 4 dogs.       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 Oropharynx normal Neck: no thyromegaly CV: RRR no murmurs rubs or gallops Lungs: CTAB no crackles, wheeze, rhonchi Abdomen: soft/nontender/nondistended/normal bowel sounds. No rebound or guarding.  Ext: no edema Skin: warm, dry Neuro: grossly normal, moves all extremities, PERRLA  Assessment/Plan:  AWV completed- discussed recommended screenings anddocumented any personalized health advice and referrals for preventive counseling. See AVS as well which was given to patient.   Hip pain, bilateral S: chronic hip, back,, neck pain that she sees a chiropractor for and would like referral. Hip pain for years off and  on and has been getting worse recently. Never sure what will trigger it sometimes exercise does. Pain up to moderate level. Advil and heat helps. Achy type pain. Similar in back and neck.  A/P: refer to chiropractor for ongoing monthly maintainenc. eVery reasonable as provides relief to continue with chiropractor  Return in about 6 months (around 10/30/2015) for physical. Return precautions advised.   Orders Placed This Encounter  Procedures  . DG Bone Density    Standing Status: Future     Number of Occurrences:      Standing Expiration Date: 06/29/2016    Order Specific Question:  Reason for Exam (SYMPTOM  OR DIAGNOSIS REQUIRED)    Answer:  post menopausal    Order Specific Question:  Preferred imaging location?    Answer:  [facility]  . Ambulatory referral to Chiropractic  Referral Priority:  Routine    Referral Type:  Chiropractic    Referral Reason:  Specialty Services Required    Requested Specialty:  Chiropractic Medicine    Number of Visits Requested:  1

## 2015-04-30 NOTE — Assessment & Plan Note (Signed)
S: chronic hip, back,, neck pain that she sees a chiropractor for and would like referral. Very reasonable as provides relief.  A/P: refer to chiropractor for ongoing monthly maintainence

## 2015-04-30 NOTE — Patient Instructions (Signed)
Ruth Wilson , Thank you for taking time to come for your Medicare Wellness Visit. I appreciate your ongoing commitment to your health goals. Please review the following plan we discussed and let me know if I can assist you in the future.   These are the goals we discussed: 1. Schedule your bone density test at check out desk 2. Continue your excellent work with exercise 3. We will call you within a week about your referral to chiropractor. If you do not hear within 2 weeks, give [place] a call.     This is a list of the screening recommended for you and due dates:  Health Maintenance  Topic Date Due  . DEXA scan (bone density measurement)  08/18/2006  . Flu Shot  08/11/2015  . Mammogram  12/22/2016  . Tetanus Vaccine  01/15/2017  . Colon Cancer Screening  03/12/2019  . Shingles Vaccine  Addressed  . Pneumonia vaccines  Completed

## 2015-10-23 ENCOUNTER — Other Ambulatory Visit (INDEPENDENT_AMBULATORY_CARE_PROVIDER_SITE_OTHER): Payer: Medicare HMO

## 2015-10-23 DIAGNOSIS — Z0000 Encounter for general adult medical examination without abnormal findings: Secondary | ICD-10-CM

## 2015-10-23 DIAGNOSIS — R319 Hematuria, unspecified: Secondary | ICD-10-CM

## 2015-10-23 LAB — CBC WITH DIFFERENTIAL/PLATELET
BASOS ABS: 0 10*3/uL (ref 0.0–0.1)
Basophils Relative: 0.9 % (ref 0.0–3.0)
Eosinophils Absolute: 0.2 10*3/uL (ref 0.0–0.7)
Eosinophils Relative: 4.1 % (ref 0.0–5.0)
HEMATOCRIT: 38.1 % (ref 36.0–46.0)
HEMOGLOBIN: 12.9 g/dL (ref 12.0–15.0)
LYMPHS PCT: 51.1 % — AB (ref 12.0–46.0)
Lymphs Abs: 2.2 10*3/uL (ref 0.7–4.0)
MCHC: 33.8 g/dL (ref 30.0–36.0)
MCV: 90.8 fl (ref 78.0–100.0)
MONOS PCT: 5.9 % (ref 3.0–12.0)
Monocytes Absolute: 0.2 10*3/uL (ref 0.1–1.0)
NEUTROS ABS: 1.6 10*3/uL (ref 1.4–7.7)
Neutrophils Relative %: 38 % — ABNORMAL LOW (ref 43.0–77.0)
Platelets: 266 10*3/uL (ref 150.0–400.0)
RBC: 4.2 Mil/uL (ref 3.87–5.11)
RDW: 13.7 % (ref 11.5–15.5)
WBC: 4.3 10*3/uL (ref 4.0–10.5)

## 2015-10-23 LAB — TSH: TSH: 1.14 u[IU]/mL (ref 0.35–4.50)

## 2015-10-23 LAB — POC URINALSYSI DIPSTICK (AUTOMATED)
BILIRUBIN UA: NEGATIVE
Glucose, UA: NEGATIVE
Ketones, UA: NEGATIVE
Leukocytes, UA: NEGATIVE
NITRITE UA: NEGATIVE
PH UA: 7
Protein, UA: NEGATIVE
SPEC GRAV UA: 1.01
UROBILINOGEN UA: 0.2

## 2015-10-23 LAB — BASIC METABOLIC PANEL
BUN: 8 mg/dL (ref 6–23)
CALCIUM: 9.4 mg/dL (ref 8.4–10.5)
CO2: 29 meq/L (ref 19–32)
Chloride: 101 mEq/L (ref 96–112)
Creatinine, Ser: 0.7 mg/dL (ref 0.40–1.20)
GFR: 86.9 mL/min (ref >60.00)
Glucose, Bld: 84 mg/dL (ref 70–99)
Potassium: 4 mEq/L (ref 3.5–5.1)
SODIUM: 137 meq/L (ref 135–145)

## 2015-10-23 LAB — HEPATIC FUNCTION PANEL
ALBUMIN: 4.5 g/dL (ref 3.5–5.2)
ALK PHOS: 45 U/L (ref 39–117)
ALT: 11 U/L (ref 0–35)
AST: 17 U/L (ref 0–37)
BILIRUBIN DIRECT: 0.1 mg/dL (ref 0.0–0.3)
TOTAL PROTEIN: 6.9 g/dL (ref 6.0–8.3)
Total Bilirubin: 0.6 mg/dL (ref 0.2–1.2)

## 2015-10-23 LAB — URINALYSIS, MICROSCOPIC ONLY

## 2015-10-23 LAB — LIPID PANEL
CHOL/HDL RATIO: 2
Cholesterol: 187 mg/dL (ref 0–200)
HDL: 79.8 mg/dL (ref >39.00)
LDL Cholesterol: 94 mg/dL (ref 0–99)
NONHDL: 106.87
Triglycerides: 63 mg/dL (ref 0.0–149.0)
VLDL: 12.6 mg/dL (ref 0.0–40.0)

## 2015-10-30 ENCOUNTER — Encounter: Payer: Self-pay | Admitting: Family Medicine

## 2015-10-30 ENCOUNTER — Ambulatory Visit (INDEPENDENT_AMBULATORY_CARE_PROVIDER_SITE_OTHER): Payer: Medicare HMO | Admitting: Family Medicine

## 2015-10-30 VITALS — BP 136/82 | HR 73 | Temp 97.7°F | Ht 63.0 in | Wt 144.4 lb

## 2015-10-30 DIAGNOSIS — Z7189 Other specified counseling: Secondary | ICD-10-CM

## 2015-10-30 DIAGNOSIS — Z23 Encounter for immunization: Secondary | ICD-10-CM | POA: Diagnosis not present

## 2015-10-30 DIAGNOSIS — M85851 Other specified disorders of bone density and structure, right thigh: Secondary | ICD-10-CM | POA: Diagnosis not present

## 2015-10-30 DIAGNOSIS — Z0000 Encounter for general adult medical examination without abnormal findings: Secondary | ICD-10-CM | POA: Diagnosis not present

## 2015-10-30 NOTE — Progress Notes (Signed)
Pre visit review using our clinic review tool, if applicable. No additional management support is needed unless otherwise documented below in the visit note. 

## 2015-10-30 NOTE — Patient Instructions (Addendum)
You are doing fantastic  Continue to control cholesterol with diet and exercise.   Would try to get calcium to 1200 mg (diet is better than supplements but sometimes you need a little of both)  I would also like for you to sign up for an annual wellness visit with our nurse, Manuela Schwartz, who specializes in the annual wellness exam in 6 months. This is a free/completely covered benefit under medicare that may help [place] find additional ways to help you. Some highlights are reviewing medications, lifestyle, and doing a dementia screen.   Also if you schedule this- you can ask your insurance company to leave you alone because you have it scheduled in our office

## 2015-10-30 NOTE — Assessment & Plan Note (Signed)
Osteopenia but does not tolerate treatments. On vitamin D supplement. Severe GI intolerance and not just reflux on the boniva and fosamax. Elevated hip fracture risk with RFN -2.4 but we opted not to treat with IV form bisphosphonate with concern of similar symptoms. Not severe enough to qualify for prolia likely (needs -2.5 and fragility fracture). Repeat in 2 years- focus on 1200mg  calcium mainly through diet. Recheck in 2 years.

## 2015-10-30 NOTE — Progress Notes (Signed)
Phone: [phone]  Subjective:  Patient presents today for their annual physical. Chief complaint-noted.   See problem oriented charting- ROS- full  review of systems was completed and negative including No chest pain or shortness of breath. No headache or blurry vision.   The following were reviewed and entered/updated in epic: Past Medical History:  Diagnosis Date  . Allergy    cat dander and seasonal  . Cataract   . Diverticulosis of colon without hemorrhage 04/30/2012  . Osteopenia    Patient Active Problem List   Diagnosis Date Noted  . Hyperlipidemia 09/30/2013    Priority: Medium  . Hip pain, bilateral 04/30/2015    Priority: Low  . Hemorrhoids 09/30/2013    Priority: Low  . Allergy to cats 09/30/2013    Priority: Low  . Family hx of colon cancer 04/30/2012    Priority: Low  . GERD 02/09/2010    Priority: Low  . INSOMNIA-SLEEP DISORDER-UNSPEC 02/09/2010    Priority: Low  . Osteopenia 10/17/2006    Priority: Low  . Advanced directives, counseling/discussion 10/30/2015   Past Surgical History:  Procedure Laterality Date  . ABDOMINAL HYSTERECTOMY    . cataract surgery     tattoo on eye due to damaged iris  . COLONOSCOPY    . OOPHORECTOMY    . POLYPECTOMY    . TONSILLECTOMY      Family History  Problem Relation Age of Onset  . Heart disease Mother     MI age 40 but lived to 23  . Colon cancer Father     died 12 from colon cancer    Medications- reviewed and updated Current Outpatient Prescriptions  Medication Sig Dispense Refill  . albuterol (PROVENTIL HFA;VENTOLIN HFA) 108 (90 BASE) MCG/ACT inhaler Inhale 2 puffs into the lungs every 6 (six) hours as needed for wheezing or shortness of breath. 1 Inhaler 0  . Cholecalciferol (VITAMIN D-3 PO) Take 2,000 Units by mouth daily.    . Coenzyme Q10 (CO Q 10 PO) Take 300 mg by mouth.    . Cyanocobalamin (VITAMIN B-12) 1000 MCG SUBL Place 1,000 mcg under the tongue.    . hydrocortisone (ANUSOL-HC) 25 MG  suppository Place 1 suppository (25 mg total) rectally at bedtime. Use 1 suppository at bedtime for 10 days then as needed. 12 suppository 0  . Ibuprofen-Diphenhydramine Cit (ADVIL PM PO) Take by mouth.     Allergies-reviewed and updated Allergies  Allergen Reactions  . Alendronate Sodium     REACTION: GI problems fosamax  . Risedronate Sodium     REACTION: GI problems boniva    Social History   Social History  . Marital status: Married    Spouse name: N/A  . Number of children: 3  . Years of education: N/A   Occupational History  . Retired Retired   Social History Main Topics  . Smoking status: Never Smoker  . Smokeless tobacco: Never Used  . Alcohol use 8.4 oz/week    14 Standard drinks or equivalent per week     Comment: advised to cut to 1 a day-red wine  . Drug use: No  . Sexual activity: Not Asked   Other Topics Concern  . None   Social History Narrative   Married 51 years in 2015. 3 children. Stacy lives in [place] middle divorced with 3 boys, Clair Gulling oldest living in [place] has 6 children and is a Restaurant manager, fast food, Gershon Mussel lives in [place] no children but 4 dogs.       Retired from  hairdressing and office work Designer, industrial/product)      Hobbies: golf, travel, exercise    Objective: BP 136/82 (BP Location: Left Arm, Patient Position: Sitting, Cuff Size: Normal)   Pulse 73   Temp 97.7 F (36.5 C) (Oral)   Ht 5\' 3"  (1.6 m)   Wt 144 lb 6.4 oz (65.5 kg)   SpO2 97%   BMI 25.58 kg/m  Gen: NAD, resting comfortably HEENT: Mucous membranes are moist. Oropharynx normal. Some scarring on R > L TM. Eye-"tattoo on left eye" as surgical correction for prior surgery Neck: no thyromegaly CV: RRR no murmurs rubs or gallops Lungs: CTAB no crackles, wheeze, rhonchi Abdomen: soft/nontender/nondistended/normal bowel sounds. No rebound or guarding.  Ext: no edema Skin: warm, dry Neuro: grossly normal, moves all extremities, PERRLA Breasts: normal appearance, no masses or  tenderness   Assessment/Plan:  75 y.o. female presenting for annual physical.  Health Maintenance counseling: 1. Anticipatory guidance: Patient counseled regarding regular dental exams, eye exams, wearing seatbelts.  2. Risk factor reduction:  Advised patient of need for regular exercise  (5 -6 days a week at the gym!!!) and diet rich and fruits and vegetables to reduce risk of heart attack and stroke. Healthy weight- stable.  Wt Readings from Last 3 Encounters:  10/30/15 144 lb 6.4 oz (65.5 kg)  04/30/15 144 lb (65.3 kg)  03/12/14 142 lb (64.4 kg)  3. Immunizations/screenings/ancillary studies Immunization History  Administered Date(s) Administered  . Influenza Split 10/12/2010, 10/26/2011  . Influenza Whole 10/11/2006, 10/31/2007, 12/24/2008, 10/13/2009  . Influenza, High Dose Seasonal PF 10/29/2012, 10/02/2013, 10/30/2015  . Influenza,inj,Quad PF,36+ Mos 09/29/2014  . Pneumococcal Conjugate-13 12/23/2014  . Pneumococcal Polysaccharide-23 01/16/2007  . Td 01/16/2007   Health Maintenance Due  Topic Date Due  . INFLUENZA VACCINE  08/11/2015  g 4. Cervical cancer screening- passed age based screening 5. Breast cancer screening-  breast exam today reassuring and mammogram 12/23/14. Discussed stopping after 76 potentially 6. Colon cancer screening - colonoscopy 2021 with family history of colon cancer with last in 2016 7. Skin cancer screening- Dr. Mary Sella dermatology  Status of chronic or acute concerns  HLD-  Well controlled with LDL <100 despite no statin- mild elevations in past Lab Results  Component Value Date   CHOL 187 10/23/2015   HDL 79.80 10/23/2015   LDLCALC 94 10/23/2015   LDLDIRECT 105.8 01/28/2010   TRIG 63.0 10/23/2015   CHOLHDL 2 10/23/2015   Insomnia- advil pm very sparingly  b12 - continues oral supplements, no history of deficiency  Albuterol on hand for cat allergy  Hemorrhoids intermittent- has suppositories as needed  Osteopenia Osteopenia but  does not tolerate treatments. On vitamin D supplement. Severe GI intolerance and not just reflux on the boniva and fosamax. Elevated hip fracture risk with RFN -2.4 but we opted not to treat with IV form bisphosphonate with concern of similar symptoms. Not severe enough to qualify for prolia likely (needs -2.5 and fragility fracture). Repeat in 2 years- focus on 1200mg  calcium mainly through diet. Recheck in 2 years.    Return in about 1 year (around 10/29/2016) for physical.  Orders Placed This Encounter  Procedures  . Flu vaccine HIGH DOSE PF    Return precautions advised.   Garret Reddish, MD

## 2015-11-25 ENCOUNTER — Other Ambulatory Visit: Payer: Self-pay | Admitting: Family Medicine

## 2015-11-25 DIAGNOSIS — Z1231 Encounter for screening mammogram for malignant neoplasm of breast: Secondary | ICD-10-CM

## 2015-12-24 ENCOUNTER — Ambulatory Visit
Admission: RE | Admit: 2015-12-24 | Discharge: 2015-12-24 | Disposition: A | Discharge status: Home / Self Care | Payer: Medicare HMO | Source: Ambulatory Visit | Attending: Family Medicine | Admitting: Family Medicine

## 2015-12-24 DIAGNOSIS — Z1231 Encounter for screening mammogram for malignant neoplasm of breast: Secondary | ICD-10-CM

## 2016-03-23 DIAGNOSIS — L570 Actinic keratosis: Secondary | ICD-10-CM | POA: Diagnosis not present

## 2016-03-23 DIAGNOSIS — D2312 Other benign neoplasm of skin of left eyelid, including canthus: Secondary | ICD-10-CM | POA: Diagnosis not present

## 2016-03-23 DIAGNOSIS — L819 Disorder of pigmentation, unspecified: Secondary | ICD-10-CM | POA: Diagnosis not present

## 2016-03-23 DIAGNOSIS — H35372 Puckering of macula, left eye: Secondary | ICD-10-CM | POA: Diagnosis not present

## 2016-04-04 DIAGNOSIS — L819 Disorder of pigmentation, unspecified: Secondary | ICD-10-CM | POA: Diagnosis not present

## 2016-04-12 DIAGNOSIS — Z961 Presence of intraocular lens: Secondary | ICD-10-CM | POA: Diagnosis not present

## 2016-04-12 DIAGNOSIS — H35342 Macular cyst, hole, or pseudohole, left eye: Secondary | ICD-10-CM | POA: Diagnosis not present

## 2016-04-12 DIAGNOSIS — Z9889 Other specified postprocedural states: Secondary | ICD-10-CM | POA: Diagnosis not present

## 2016-04-12 DIAGNOSIS — H21262 Iris atrophy (essential) (progressive), left eye: Secondary | ICD-10-CM | POA: Diagnosis not present

## 2016-04-19 DIAGNOSIS — H35342 Macular cyst, hole, or pseudohole, left eye: Secondary | ICD-10-CM | POA: Diagnosis not present

## 2016-04-28 DIAGNOSIS — Z961 Presence of intraocular lens: Secondary | ICD-10-CM | POA: Diagnosis not present

## 2016-04-28 DIAGNOSIS — H53452 Other localized visual field defect, left eye: Secondary | ICD-10-CM | POA: Diagnosis not present

## 2016-04-28 DIAGNOSIS — H538 Other visual disturbances: Secondary | ICD-10-CM | POA: Diagnosis not present

## 2016-04-28 DIAGNOSIS — H35342 Macular cyst, hole, or pseudohole, left eye: Secondary | ICD-10-CM | POA: Diagnosis not present

## 2016-04-28 DIAGNOSIS — H21262 Iris atrophy (essential) (progressive), left eye: Secondary | ICD-10-CM | POA: Diagnosis not present

## 2016-05-06 DIAGNOSIS — Z961 Presence of intraocular lens: Secondary | ICD-10-CM | POA: Diagnosis not present

## 2016-05-06 DIAGNOSIS — H21262 Iris atrophy (essential) (progressive), left eye: Secondary | ICD-10-CM | POA: Diagnosis not present

## 2016-05-06 DIAGNOSIS — H35342 Macular cyst, hole, or pseudohole, left eye: Secondary | ICD-10-CM | POA: Diagnosis not present

## 2016-06-20 DIAGNOSIS — H43821 Vitreomacular adhesion, right eye: Secondary | ICD-10-CM | POA: Diagnosis not present

## 2016-06-20 DIAGNOSIS — Z9841 Cataract extraction status, right eye: Secondary | ICD-10-CM | POA: Diagnosis not present

## 2016-06-20 DIAGNOSIS — Z79899 Other long term (current) drug therapy: Secondary | ICD-10-CM | POA: Diagnosis not present

## 2016-06-20 DIAGNOSIS — Z9889 Other specified postprocedural states: Secondary | ICD-10-CM | POA: Diagnosis not present

## 2016-06-20 DIAGNOSIS — E785 Hyperlipidemia, unspecified: Secondary | ICD-10-CM | POA: Diagnosis not present

## 2016-06-20 DIAGNOSIS — Z48810 Encounter for surgical aftercare following surgery on the sense organs: Secondary | ICD-10-CM | POA: Diagnosis not present

## 2016-06-20 DIAGNOSIS — H35342 Macular cyst, hole, or pseudohole, left eye: Secondary | ICD-10-CM | POA: Diagnosis not present

## 2016-06-20 DIAGNOSIS — Z961 Presence of intraocular lens: Secondary | ICD-10-CM | POA: Diagnosis not present

## 2016-06-20 DIAGNOSIS — Z9842 Cataract extraction status, left eye: Secondary | ICD-10-CM | POA: Diagnosis not present

## 2016-06-20 DIAGNOSIS — H21262 Iris atrophy (essential) (progressive), left eye: Secondary | ICD-10-CM | POA: Diagnosis not present

## 2016-08-29 DIAGNOSIS — H35342 Macular cyst, hole, or pseudohole, left eye: Secondary | ICD-10-CM | POA: Diagnosis not present

## 2016-08-29 DIAGNOSIS — H43821 Vitreomacular adhesion, right eye: Secondary | ICD-10-CM | POA: Diagnosis not present

## 2016-08-29 DIAGNOSIS — H21262 Iris atrophy (essential) (progressive), left eye: Secondary | ICD-10-CM | POA: Diagnosis not present

## 2016-08-29 DIAGNOSIS — Z961 Presence of intraocular lens: Secondary | ICD-10-CM | POA: Diagnosis not present

## 2016-09-27 ENCOUNTER — Ambulatory Visit (INDEPENDENT_AMBULATORY_CARE_PROVIDER_SITE_OTHER): Payer: PPO | Admitting: *Deleted

## 2016-09-27 DIAGNOSIS — Z23 Encounter for immunization: Secondary | ICD-10-CM

## 2016-11-15 ENCOUNTER — Ambulatory Visit: Payer: PPO | Admitting: Family Medicine

## 2016-11-15 ENCOUNTER — Encounter: Payer: Self-pay | Admitting: Family Medicine

## 2016-11-15 VITALS — BP 138/76 | HR 81 | Temp 97.5°F | Ht 63.0 in | Wt 138.0 lb

## 2016-11-15 DIAGNOSIS — K529 Noninfective gastroenteritis and colitis, unspecified: Secondary | ICD-10-CM | POA: Diagnosis not present

## 2016-11-15 LAB — COMPREHENSIVE METABOLIC PANEL
ALT: 15 U/L (ref 0–35)
AST: 18 U/L (ref 0–37)
Albumin: 4.4 g/dL (ref 3.5–5.2)
Alkaline Phosphatase: 54 U/L (ref 39–117)
BUN: 8 mg/dL (ref 6–23)
CO2: 28 meq/L (ref 19–32)
CREATININE: 0.6 mg/dL (ref 0.40–1.20)
Calcium: 10 mg/dL (ref 8.4–10.5)
Chloride: 100 mEq/L (ref 96–112)
GFR: 103.51 mL/min (ref >60.00)
Glucose, Bld: 97 mg/dL (ref 70–99)
Potassium: 4.7 mEq/L (ref 3.5–5.1)
Sodium: 136 mEq/L (ref 135–145)
TOTAL PROTEIN: 7.1 g/dL (ref 6.0–8.3)
Total Bilirubin: 0.6 mg/dL (ref 0.2–1.2)

## 2016-11-15 LAB — CBC WITH DIFFERENTIAL/PLATELET
BASOS ABS: 0 10*3/uL (ref 0.0–0.1)
Basophils Relative: 0.6 % (ref 0.0–3.0)
Eosinophils Absolute: 0.1 10*3/uL (ref 0.0–0.7)
Eosinophils Relative: 2.1 % (ref 0.0–5.0)
HEMATOCRIT: 40.7 % (ref 36.0–46.0)
Hemoglobin: 13.5 g/dL (ref 12.0–15.0)
LYMPHS ABS: 2 10*3/uL (ref 0.7–4.0)
LYMPHS PCT: 38.2 % (ref 12.0–46.0)
MCHC: 33.2 g/dL (ref 30.0–36.0)
MCV: 94.3 fl (ref 78.0–100.0)
Monocytes Absolute: 0.4 10*3/uL (ref 0.1–1.0)
Monocytes Relative: 7.9 % (ref 3.0–12.0)
NEUTROS PCT: 51.2 % (ref 43.0–77.0)
Neutro Abs: 2.7 10*3/uL (ref 1.4–7.7)
Platelets: 277 10*3/uL (ref 150.0–400.0)
RBC: 4.31 Mil/uL (ref 3.87–5.11)
RDW: 13.9 % (ref 11.5–15.5)
WBC: 5.2 10*3/uL (ref 4.0–10.5)

## 2016-11-15 NOTE — Patient Instructions (Signed)
Diarrhea for 1 month. Doubt infectious but rule out infectious causes. With stool studies (pick up collection kit from labs) - stop metamucil -blood work today  If all this is negative would consider 2-3 week course of probiotic before GI referral

## 2016-11-15 NOTE — Progress Notes (Signed)
Subjective:  Ruth Wilson is a 76 y.o. year old very pleasant female patient who presents for/with See problem oriented charting ROS- no nausea or vomiting. No abdominal pain for most part. No fever or night sweats.    Past Medical History-  Patient Active Problem List   Diagnosis Date Noted  . Hyperlipidemia 09/30/2013    Priority: Medium  . Osteopenia 10/17/2006    Priority: Medium  . Hip pain, bilateral 04/30/2015    Priority: Low  . Hemorrhoids 09/30/2013    Priority: Low  . Allergy to cats 09/30/2013    Priority: Low  . Family hx of colon cancer 04/30/2012    Priority: Low  . GERD 02/09/2010    Priority: Low  . INSOMNIA-SLEEP DISORDER-UNSPEC 02/09/2010    Priority: Low  . Advanced directives, counseling/discussion 10/30/2015    Medications- reviewed and updated Current Outpatient Medications  Medication Sig Dispense Refill  . albuterol (PROVENTIL HFA;VENTOLIN HFA) 108 (90 BASE) MCG/ACT inhaler Inhale 2 puffs into the lungs every 6 (six) hours as needed for wheezing or shortness of breath. 1 Inhaler 0  . Cholecalciferol (VITAMIN D-3 PO) Take 2,000 Units by mouth daily.    . Coenzyme Q10 (CO Q 10 PO) Take 300 mg by mouth.    . Cyanocobalamin (VITAMIN B-12) 1000 MCG SUBL Place 1,000 mcg under the tongue.    . hydrocortisone (ANUSOL-HC) 25 MG suppository Place 1 suppository (25 mg total) rectally at bedtime. Use 1 suppository at bedtime for 10 days then as needed. 12 suppository 0  . Ibuprofen-Diphenhydramine Cit (ADVIL PM PO) Take by mouth.    . psyllium (METAMUCIL) 58.6 % powder Take 1 packet 3 (three) times daily by mouth.     No current facility-administered medications for this visit.     Objective: BP 138/76 (BP Location: Left Arm, Patient Position: Sitting, Cuff Size: Large)   Pulse 81   Temp (!) 97.5 F (36.4 C) (Oral)   Ht 5\' 3"  (1.6 m)   Wt 138 lb (62.6 kg)   SpO2 98%   BMI 24.45 kg/m  Gen: NAD, resting comfortably Mucous membranes are moist. CV:  RRR no murmurs rubs or gallops Lungs: CTAB no crackles, wheeze, rhonchi Abdomen: soft/nontender except mildest of tenderness just below umbilicus/nondistended/normal bowel sounds. No rebound or guarding.  Ext: no edema Skin: warm, dry Neuro: normal gait  Assessment/Plan:  Chronic Diarrhea S: 1 month ago was at [facility] and started with frequent bowel movements. Very loose frequent bowel movements for most part up to every 2 hours. Almost every night starts at 12 or 1 o clock and up every 2-3 hours with bowel movement. On a good night may only get up once. Going she thinks 6-10 times each day. Continuing metamucil. Does not note clear food pattern- tried avoiding salads and no help. First week when started was very tired and that passed. Had been trying to lose weight prior but does think has lost a few lbs. One week had some blood with this- but can have blood with hemorrhoids and not atypical for her. Father died of colon cancer. 11-Apr-2014 last colonoscopy largely normal- no polyp history. Drinking a lot of water to try to keep up with fluid losses. No recent antibiotic use. Continues to take metamucil.  A/P: Diarrhea for 1 month. Doubt infectious but rule out infectious causes.  - stop metamucil -cbc diff, cmp -stool studies -father with colon cancer but colonoscopy 2016- doubt this is cause of symptoms -consider probiotics if workup unrevealing  Orders Placed This Encounter  Procedures  . Stool culture    Standing Status:   Future    Standing Expiration Date:   11/15/2017  . Clostridium Difficile by PCR    Standing Status:   Future    Standing Expiration Date:   11/15/2017    Order Specific Question:   Is your patient experiencing loose or watery stools (3 or more in 24 hours)?    Answer:   Yes    Order Specific Question:   Has the patient received laxatives in the last 24 hours?    Answer:   No    Order Specific Question:   Has a negative Cdiff test resulted in the last 7 days?     Answer:   No  . CBC with Differential/Platelet  . Comprehensive metabolic panel        STOP Meds ordered this encounter  Medications  . psyllium (METAMUCIL) 58.6 % powder    Sig: Take 1 packet 3 (three) times daily by mouth.   Return precautions advised.  Garret Reddish, MD

## 2016-11-16 ENCOUNTER — Other Ambulatory Visit: Payer: PPO

## 2016-11-16 DIAGNOSIS — K529 Noninfective gastroenteritis and colitis, unspecified: Secondary | ICD-10-CM | POA: Diagnosis not present

## 2016-11-17 LAB — CLOSTRIDIUM DIFFICILE BY PCR: CDIFFPCR: NOT DETECTED

## 2016-11-20 LAB — STOOL CULTURE
MICRO NUMBER:: 81252827
MICRO NUMBER:: 81252828
MICRO NUMBER:: 81252829
SHIGA RESULT:: NOT DETECTED
SPECIMEN QUALITY: ADEQUATE
SPECIMEN QUALITY:: ADEQUATE
SPECIMEN QUALITY:: ADEQUATE

## 2016-11-21 ENCOUNTER — Encounter: Payer: Self-pay | Admitting: Family Medicine

## 2016-11-22 ENCOUNTER — Other Ambulatory Visit: Payer: Self-pay | Admitting: Family Medicine

## 2016-11-22 DIAGNOSIS — Z1231 Encounter for screening mammogram for malignant neoplasm of breast: Secondary | ICD-10-CM

## 2017-01-06 ENCOUNTER — Ambulatory Visit: Payer: PPO

## 2017-01-24 ENCOUNTER — Ambulatory Visit
Admission: RE | Admit: 2017-01-24 | Discharge: 2017-01-24 | Disposition: A | Discharge status: Home / Self Care | Payer: PPO | Source: Ambulatory Visit | Attending: Family Medicine | Admitting: Family Medicine

## 2017-01-24 DIAGNOSIS — Z1231 Encounter for screening mammogram for malignant neoplasm of breast: Secondary | ICD-10-CM

## 2017-03-03 DIAGNOSIS — Z961 Presence of intraocular lens: Secondary | ICD-10-CM | POA: Diagnosis not present

## 2017-03-03 DIAGNOSIS — H35342 Macular cyst, hole, or pseudohole, left eye: Secondary | ICD-10-CM | POA: Diagnosis not present

## 2017-03-03 DIAGNOSIS — H43821 Vitreomacular adhesion, right eye: Secondary | ICD-10-CM | POA: Diagnosis not present

## 2017-03-03 DIAGNOSIS — H21262 Iris atrophy (essential) (progressive), left eye: Secondary | ICD-10-CM | POA: Diagnosis not present

## 2017-04-10 ENCOUNTER — Ambulatory Visit (INDEPENDENT_AMBULATORY_CARE_PROVIDER_SITE_OTHER): Payer: PPO | Admitting: Family Medicine

## 2017-04-10 ENCOUNTER — Encounter: Payer: Self-pay | Admitting: Family Medicine

## 2017-04-10 ENCOUNTER — Ambulatory Visit: Payer: Self-pay | Admitting: *Deleted

## 2017-04-10 VITALS — BP 142/86 | HR 71 | Temp 97.8°F | Ht 63.0 in | Wt 135.4 lb

## 2017-04-10 DIAGNOSIS — Z23 Encounter for immunization: Secondary | ICD-10-CM | POA: Diagnosis not present

## 2017-04-10 DIAGNOSIS — R42 Dizziness and giddiness: Secondary | ICD-10-CM | POA: Diagnosis not present

## 2017-04-10 LAB — CBC
HEMATOCRIT: 38.7 % (ref 36.0–46.0)
HEMOGLOBIN: 13 g/dL (ref 12.0–15.0)
MCHC: 33.5 g/dL (ref 30.0–36.0)
MCV: 92 fl (ref 78.0–100.0)
PLATELETS: 297 10*3/uL (ref 150.0–400.0)
RBC: 4.21 Mil/uL (ref 3.87–5.11)
RDW: 13.4 % (ref 11.5–15.5)
WBC: 4.6 10*3/uL (ref 4.0–10.5)

## 2017-04-10 LAB — COMPREHENSIVE METABOLIC PANEL
ALK PHOS: 44 U/L (ref 39–117)
ALT: 13 U/L (ref 0–35)
AST: 16 U/L (ref 0–37)
Albumin: 4.3 g/dL (ref 3.5–5.2)
BUN: 7 mg/dL (ref 6–23)
CALCIUM: 9.5 mg/dL (ref 8.4–10.5)
CO2: 27 mEq/L (ref 19–32)
Chloride: 98 mEq/L (ref 96–112)
Creatinine, Ser: 0.63 mg/dL (ref 0.40–1.20)
GFR: 97.74 mL/min (ref >60.00)
GLUCOSE: 109 mg/dL — AB (ref 70–99)
POTASSIUM: 4.1 meq/L (ref 3.5–5.1)
Sodium: 133 mEq/L — ABNORMAL LOW (ref 135–145)
Total Bilirubin: 0.7 mg/dL (ref 0.2–1.2)
Total Protein: 6.8 g/dL (ref 6.0–8.3)

## 2017-04-10 LAB — TSH: TSH: 1.78 u[IU]/mL (ref 0.35–4.50)

## 2017-04-10 NOTE — Telephone Encounter (Signed)
Pt called with complaints of dizziness,complete body sweats, and nausea multiple times a few weeks ago (March 7 or 14, 2019, pt not sure of date); she states that she also "felt washed out after it happened"; yesterday the same thing happened; her BP readings were: 04/09/17 126/72 pulse 77 at 1430               120/61          76 at 1700               127'/72         81 at  2000 04/10/17    133/74          69  at  0730   These readings were taken with a cuff on left arm with the pt sitting; recommendations made per nurse triage protocol to include seeing a physician within 24 hours; pt appointment scheduled per [facility] agent, Angie with Dr Yong Channel, [facility] today at  [place]; pt verbalizes understanding; will route to office for notification of this upcoming appointment.     Reason for Disposition . [1] MODERATE dizziness (e.g., interferes with normal activities) AND [2] has NOT been evaluated by physician for this  (Exception: dizziness caused by heat exposure, sudden standing, or poor fluid intake)  Answer Assessment - Initial Assessment Questions 1. DESCRIPTION: "Describe your dizziness."     Lightheaded and flushed 2. LIGHTHEADED: "Do you feel lightheaded?" (e.g., somewhat faint, woozy, weak upon standing)     [id number] 3. VERTIGO: "Do you feel like either you or the room is spinning or tilting?" (i.e. vertigo)     no 4. SEVERITY: "How bad is it?"  "Do you feel like you are going to faint?" "Can you stand and walk?"   - MILD - walking normally   - MODERATE - interferes with normal activities (e.g., work, school)    - SEVERE - unable to stand, requires support to walk, feels like passing out now.      severe 5. ONSET:  "When did the dizziness begin?"      March 7, [id number] 6. AGGRAVATING FACTORS: "Does anything make it worse?" (e.g., standing, change in head position)     no 7. HEART RATE: "Can you tell me your heart rate?" "How many beats in 15 seconds?"  (Note: not all patients can do  this)       n/a 8. CAUSE: "What do you think is causing the dizziness?"     unsure 9. RECURRENT SYMPTOM: "Have you had dizziness before?" If so, ask: "When was the last time?" "What happened that time?"     Yes 03/16/17 10. OTHER SYMPTOMS: "Do you have any other symptoms?" (e.g., fever, chest pain, vomiting, diarrhea, bleeding)       nausea 11. PREGNANCY: "Is there any chance you are pregnant?" "When was your last menstrual period?"       no  Protocols used: DIZZINESS [facility]

## 2017-04-10 NOTE — Patient Instructions (Addendum)
EKG is reassuring  Lets get some bloodwork  Your blood pressure is running slightly higher than normal. Lets check back in 2-4 weeks from now - I do want to see you sooner if you have recurrent episodes like yesterday though. I would consider having you wear a heart monitor

## 2017-04-10 NOTE — Progress Notes (Signed)
Subjective:  Ruth Wilson is a 77 y.o. year old very pleasant female patient who presents for/with See problem oriented charting ROS- No facial or extremity weakness. No slurred words or trouble swallowing. no blurry vision or double vision. No paresthesias. No confusion or word finding difficulties.    Past Medical History-  Patient Active Problem List   Diagnosis Date Noted  . Hyperlipidemia 09/30/2013    Priority: Medium  . Osteopenia 10/17/2006    Priority: Medium  . Hip pain, bilateral 04/30/2015    Priority: Low  . Hemorrhoids 09/30/2013    Priority: Low  . Allergy to cats 09/30/2013    Priority: Low  . Family hx of colon cancer 04/30/2012    Priority: Low  . GERD 02/09/2010    Priority: Low  . INSOMNIA-SLEEP DISORDER-UNSPEC 02/09/2010    Priority: Low  . Advanced directives, counseling/discussion 10/30/2015    Medications- reviewed and updated Current Outpatient Medications  Medication Sig Dispense Refill  . albuterol (PROVENTIL HFA;VENTOLIN HFA) 108 (90 BASE) MCG/ACT inhaler Inhale 2 puffs into the lungs every 6 (six) hours as needed for wheezing or shortness of breath. 1 Inhaler 0  . Cholecalciferol (VITAMIN D-3 PO) Take 2,000 Units by mouth daily.    . Coenzyme Q10 (CO Q 10 PO) Take 1 capsule by mouth.     . Cyanocobalamin (VITAMIN B-12) 1000 MCG SUBL Place 1,000 mcg under the tongue.    . hydrocortisone (ANUSOL-HC) 25 MG suppository Place 1 suppository (25 mg total) rectally at bedtime. Use 1 suppository at bedtime for 10 days then as needed. 12 suppository 0  . Ibuprofen-Diphenhydramine Cit (ADVIL PM PO) Take by mouth.    . psyllium (METAMUCIL) 58.6 % powder Take 1 packet 3 (three) times daily by mouth.     Objective: BP (!) 142/86 (BP Location: Left Arm, Patient Position: Sitting, Cuff Size: Normal)   Pulse 71   Temp 97.8 F (36.6 C) (Oral)   Ht 5\' 3"  (1.6 m)   Wt 135 lb 6.4 oz (61.4 kg)   SpO2 100%   BMI 23.99 kg/m  Gen: NAD, resting comfortably Tm  normal, oropharynx normal CV: RRR no murmurs rubs or gallops Lungs: CTAB no crackles, wheeze, rhonchi Abdomen: soft/nontender/nondistended/normal bowel sounds. No rebound or guarding.  Ext: no edema Skin: warm, dry  EKG: sinus rhythm with rate 69, normal axis, normal intervals, no hypertrophy. Less than 2 block elevation in v3 in 2 of 3 beats- one of the beats less than 1 box elevation. Otherwise no st or t wave changes.   Assessment/Plan:  Need for prophylactic vaccination with combined diphtheria-tetanus-pertussis (DTP) vaccine - Plan: Tdap vaccine greater than or equal to 7yo IM  Dizzy spells - Plan: EKG 12-Lead S: about a month ago coming home from the gym starte dfeeling nauseous, dizzy, broke out in sweat. Husband was driving. Got home and rested and helped but within an hour seemed ot recur- nausea, body sweat. Happened 3-4 x that day. No issues in last month  Exercises 5-6 days a week- does elliptical, ab crunches, leg lifts- has not had any issues while working out.   Yesterday was [place] (very light pace- relaxing morning) . Yesterday same thing happened- broke out in full sweat, was naueous. Yesterday only happened once. Happened for a few minutes. Doesn't feel steady.   Never had similar occurrence in the past. No chest pain. No shortness of breath. Regular water drinker and was well hydrated yesterday.  A/P: Unclear  cause of spells. Doubt hypoglycemia- had eaten in each case plus with recurrence on first day- makes thisless likely. EKG reassuring- no chest pain- doubt ischemic. Could be erythema related- we discussed potentially doing holter monitor but given a month between symptoms- may be hard to pick up on 48-72 hour monitor- we would consider this if recurs. Will get labs though- cbc, cmp, tsh today.   BP up slightly today- offered 2 week follow up but will be out of city at beach so we agreed to keeping her June appointment though she will  return if recurrence prior to that time.  Future Appointments  Date Time Provider [place]  06/20/2017  9:45 AM Yong Channel, Brayton Mars, MD [facility]   Lab/Order associations: Dizzy spells - Plan: EKG 12-Lead, CBC, Comprehensive metabolic panel, TSH  Need for prophylactic vaccination with combined diphtheria-tetanus-pertussis (DTP) vaccine - Plan: Tdap vaccine greater than or equal to 7yo IM  Return precautions advised.  Garret Reddish, MD

## 2017-04-10 NOTE — Telephone Encounter (Signed)
noted 

## 2017-06-20 ENCOUNTER — Ambulatory Visit (INDEPENDENT_AMBULATORY_CARE_PROVIDER_SITE_OTHER): Payer: PPO | Admitting: Family Medicine

## 2017-06-20 ENCOUNTER — Encounter: Payer: Self-pay | Admitting: Family Medicine

## 2017-06-20 VITALS — BP 134/80 | HR 63 | Temp 97.5°F | Ht 63.5 in | Wt 135.2 lb

## 2017-06-20 DIAGNOSIS — Z0000 Encounter for general adult medical examination without abnormal findings: Secondary | ICD-10-CM | POA: Diagnosis not present

## 2017-06-20 DIAGNOSIS — R42 Dizziness and giddiness: Secondary | ICD-10-CM

## 2017-06-20 DIAGNOSIS — E785 Hyperlipidemia, unspecified: Secondary | ICD-10-CM

## 2017-06-20 DIAGNOSIS — M85851 Other specified disorders of bone density and structure, right thigh: Secondary | ICD-10-CM

## 2017-06-20 MED ORDER — ALBUTEROL SULFATE 108 (90 BASE) MCG/ACT IN AEPB: 2.0000 | INHALATION_SPRAY | Freq: Four times a day (QID) | RESPIRATORY_TRACT | 1 refills | Status: AC | PRN

## 2017-06-20 NOTE — Progress Notes (Signed)
Phone: [phone]  Subjective:  Patient presents today for their annual physical. Chief complaint-noted.   See problem oriented charting- ROS- full  review of systems was completed and negative except for: did have one more dizzy, sweaty spell as below  The following were reviewed and entered/updated in epic: Past Medical History:  Diagnosis Date  . Allergy    cat dander and seasonal  . Cataract   . Diverticulosis of colon without hemorrhage 04/30/2012  . Osteopenia    Patient Active Problem List   Diagnosis Date Noted  . Dizzy spells 06/20/2017    Priority: High  . Hyperlipidemia, unspecified 09/30/2013    Priority: Medium  . Osteopenia 10/17/2006    Priority: Medium  . Advanced directives, counseling/discussion 10/30/2015    Priority: Low  . Hip pain, bilateral 04/30/2015    Priority: Low  . Hemorrhoids 09/30/2013    Priority: Low  . Allergy to cats 09/30/2013    Priority: Low  . Family hx of colon cancer 04/30/2012    Priority: Low  . GERD 02/09/2010    Priority: Low  . INSOMNIA-SLEEP DISORDER-UNSPEC 02/09/2010    Priority: Low   Past Surgical History:  Procedure Laterality Date  . cataract surgery     tattoo on eye due to damaged iris  . COLONOSCOPY    . OOPHORECTOMY     bilateral  . POLYPECTOMY    . TONSILLECTOMY    . TOTAL ABDOMINAL HYSTERECTOMY      Family History  Problem Relation Age of Onset  . Heart disease Mother        MI age 21 but lived to 34  . Colon cancer Father        died 61 from colon cancer    Medications- reviewed and updated Current Outpatient Medications  Medication Sig Dispense Refill  . Albuterol Sulfate (PROAIR RESPICLICK) 073 (90 Base) MCG/ACT AEPB Inhale 2 puffs into the lungs every 6 (six) hours as needed (wheezing or shortness of breath with cat exposure). 1 each 1  . Cholecalciferol (VITAMIN D-3 PO) Take 2,000 Units by mouth daily.    . Coenzyme Q10 (CO Q 10 PO) Take 1 capsule by mouth.     . Cyanocobalamin  (VITAMIN B-12) 1000 MCG SUBL Place 1,000 mcg under the tongue.    . hydrocortisone (ANUSOL-HC) 25 MG suppository Place 1 suppository (25 mg total) rectally at bedtime. Use 1 suppository at bedtime for 10 days then as needed. 12 suppository 0  . Ibuprofen-Diphenhydramine Cit (ADVIL PM PO) Take by mouth.    . psyllium (METAMUCIL) 58.6 % powder Take 1 packet 3 (three) times daily by mouth.     No current facility-administered medications for this visit.     Allergies-reviewed and updated Allergies  Allergen Reactions  . Alendronate Sodium     REACTION: GI problems fosamax  . Risedronate Sodium     REACTION: GI problems boniva    Social History   Social History Narrative   Married 51 years in 2015. 3 children. Stacy lives in [place] middle divorced with 3 boys, Clair Gulling oldest living in [place] has 6 children and is a Restaurant manager, fast food, Gershon Mussel lives in [place] no children but 4 dogs.       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 Oropharynx normal Neck: no thyromegaly CV: RRR no murmurs rubs or gallops Breasts: normal appearance, no masses or tenderness Lungs: CTAB no crackles, wheeze, rhonchi Abdomen: soft/nontender/nondistended/normal bowel sounds. No rebound or guarding.  Ext: no edema Skin: warm, dry Neuro: grossly normal, moves all extremities, PERRLA  Assessment/Plan:  77 y.o. female presenting for annual physical.  Health Maintenance counseling: 1. Anticipatory guidance: Patient counseled regarding regular dental exams -q6 months, eye exams - yearly, wearing seatbelts.  2. Risk factor reduction:  Advised patient of need for regular exercise and  diet rich and fruits and vegetables to reduce risk of heart attack and stroke. Exercise- was at the gym 5 to 6 days a week last visit- keeping this up and no dizzy spells with exercise . Diet-reasonably healthy.  Wt Readings from Last 3 Encounters:  06/20/17 135 lb 3.2 oz (61.3 kg)  04/10/17 135 lb 6.4 oz (61.4 kg)  11/15/16 138 lb (62.6 kg)  3. Immunizations/screenings/ancillary studies-- We discussed shingrix availability issues  as well as coverage issues (part D medicare)- I recommended that patient get vaccine at the pharmacy   Immunization History  Administered Date(s) Administered  . Influenza Split 10/12/2010, 10/26/2011  . Influenza Whole 10/11/2006, 10/31/2007, 12/24/2008, 10/13/2009  . Influenza, High Dose Seasonal PF 10/29/2012, 10/02/2013, 10/30/2015, 09/27/2016  . Influenza,inj,Quad PF,6+ Mos 09/29/2014  . Pneumococcal Conjugate-13 12/23/2014  . Pneumococcal Polysaccharide-23 01/16/2007  . Td 01/16/2007  . Tdap 04/10/2017  4. Cervical cancer screening- she has passed age based screening.  Denies vaginal discharge or bleeding 5. Breast cancer screening-  breast exam today reassuring and mammogram 01/24/17 benign findings 6. Colon cancer screening - 2021 he will be due.  Last in 2016 and repeat due to family history in father 80. Skin cancer screening-sees Dr. Ronnald Ramp of [facility] [place] dermatology . advised regular sunscreen use. Denies worrisome, changing, or new skin lesions.  8. Birth control/STD check- hysterectomy and monogamous 9. Osteoporosis screening at 54- from last visit "Osteopenia but does not tolerate treatments. On vitamin D supplement. Severe GI intolerance and not just reflux on the boniva and fosamax. Elevated hip fracture risk with RFN -2.4 but we opted not to treat with IV form bisphosphonate with concern of similar symptoms. Not severe enough to qualify for prolia likely (needs -2.5 and fragility fracture). Repeat in 2 years- focus on 1200mg  calcium mainly through diet. Recheck  in 2 years. " She is on calcium and vitamin D still- we agreed to update DEXA and try for prolia if -2.5 or worse t score.   Status of chronic or acute concerns   Insomnia- uses Advil PM very sparingly.  We will monitor bmet.  Albuterol on hand for cat allergy.  Intermittent hemorrhoids- uses suppositories as needed.   Osteopenia from last visit "Osteopenia but does not tolerate treatments. On vitamin D supplement. Severe GI intolerance and not just reflux on the boniva and fosamax. Elevated hip fracture risk with RFN -2.4 but we opted not to treat with IV form bisphosphonate with concern of similar symptoms. Not severe enough to qualify for prolia likely (needs -2.5 and fragility fracture). Repeat in 2 years- focus on 1200mg  calcium mainly through diet. Recheck in 2 years. " She is on calcium and vitamin D still- we agreed to update DEXA and try for prolia if -2.5 or worse  t score.   Dizzy spells Had one more dizzy spell out at the beach in last 2 months, broke out in sweat and then none since that time. Gets tired with it and just wants to go to bed. She declines repeat for now unless there is reucrrence. Did have 3 bottles of water out on beach and with slightly low sodium we discussed perhaps using a gatorade every other beverage for electrolyte support. Had just eaten so not hypoglycemia. If happens one more time- we agreed to go ahead and wear 30 monitor.   From last visit  "Dizzy spells - Plan: EKG 12-Lead S: about a month ago coming home from the gym starte dfeeling nauseous, dizzy, broke out in sweat. Husband was driving. Got home and rested and helped but within an hour seemed ot recur- nausea, body sweat. Happened 3-4 x that day. No issues in last month  Exercises 5-6 days a week- does elliptical, ab crunches, leg lifts- has not had any issues while working out.   Yesterday was walking around the homeshow with friends (very light pace- relaxing morning) . Yesterday same thing  happened- broke out in full sweat, was naueous. Yesterday only happened once. Happened for a few minutes. Doesn't feel steady.   Never had similar occurrence in the past. No chest pain. No shortness of breath. Regular water drinker and was well hydrated yesterday.  A/P: Unclear cause of spells. Doubt hypoglycemia- had eaten in each case plus with recurrence on first day- makes thisless likely. EKG reassuring- no chest pain- doubt ischemic. Could be erythema related- we discussed potentially doing holter monitor but given a month between symptoms- may be hard to pick up on 48-72 hour monitor- we would consider this if recurs. Will get labs though- cbc, cmp, tsh today.   BP up slightly today- offered 2 week follow up but will be out of city at beach so we agreed to keeping her June appointment though she will return if recurrence prior to that time. "  Hyperlipidemia, unspecified Hyperlipidemia- has had mild elevations in the past.  More recently has been well controlled with no statin  Return in about 1 year (around 06/21/2018) for physical. or immediately if recurrent dizzy/sweaty spells.  Lab/Order associations: Osteopenia of neck of right femur - Plan: DG Bone Density  Hyperlipidemia, unspecified hyperlipidemia type - Plan: Comprehensive metabolic panel, Lipid panel  Meds ordered this encounter  Medications  . Albuterol Sulfate (PROAIR RESPICLICK) 606 (90 Base) MCG/ACT AEPB    Sig: Inhale 2 puffs into the lungs every 6 (six) hours as needed (wheezing or shortness of breath with cat exposure).    Dispense:  1 each    Refill:  1    Return precautions advised.  Garret Reddish, MD

## 2017-06-20 NOTE — Assessment & Plan Note (Signed)
Had one more dizzy spell out at the beach in last 2 months, broke out in sweat and then none since that time. Gets tired with it and just wants to go to bed. She declines repeat for now unless there is reucrrence. Did have 3 bottles of water out on beach and with slightly low sodium we discussed perhaps using a gatorade every other beverage for electrolyte support. Had just eaten so not hypoglycemia. If happens one more time- we agreed to go ahead and wear 30 monitor.   From last visit  "Dizzy spells - Plan: EKG 12-Lead S: about a month ago coming home from the gym starte dfeeling nauseous, dizzy, broke out in sweat. Husband was driving. Got home and rested and helped but within an hour seemed ot recur- nausea, body sweat. Happened 3-4 x that day. No issues in last month  Exercises 5-6 days a week- does elliptical, ab crunches, leg lifts- has not had any issues while working out.   Yesterday was walking around the homeshow with friends (very light pace- relaxing morning) . Yesterday same thing happened- broke out in full sweat, was naueous. Yesterday only happened once. Happened for a few minutes. Doesn't feel steady.   Never had similar occurrence in the past. No chest pain. No shortness of breath. Regular water drinker and was well hydrated yesterday.  A/P: Unclear cause of spells. Doubt hypoglycemia- had eaten in each case plus with recurrence on first day- makes thisless likely. EKG reassuring- no chest pain- doubt ischemic. Could be erythema related- we discussed potentially doing holter monitor but given a month between symptoms- may be hard to pick up on 48-72 hour monitor- we would consider this if recurs. Will get labs though- cbc, cmp, tsh today.   BP up slightly today- offered 2 week follow up but will be out of city at beach so we agreed to keeping her June appointment though she will return if recurrence prior to that time. "

## 2017-06-20 NOTE — Patient Instructions (Addendum)
Please check with your pharmacy to see if they have the shingrix vaccine. If they do- please get this immunization and update [place] by phone call or mychart with dates you receive the vaccine   Schedule your bone density test at check out desk. You may also call directly to X-ray at [phone] to schedule an appointment that is convenient for you.  - located [address] across the street from [facility] - in the basement - you do need an appointment for the bone density tests.    Refill albuterol today   Schedule a lab visit at the check out desk within 2 weeks. Return for future fasting labs meaning nothing but water after midnight please. Ok to take your medications with water.

## 2017-06-20 NOTE — Assessment & Plan Note (Signed)
Hyperlipidemia- has had mild elevations in the past.  More recently has been well controlled with no statin

## 2017-06-20 NOTE — Assessment & Plan Note (Signed)
from last visit "Osteopenia but does not tolerate treatments. On vitamin D supplement. Severe GI intolerance and not just reflux on the boniva and fosamax. Elevated hip fracture risk with RFN -2.4 but we opted not to treat with IV form bisphosphonate with concern of similar symptoms. Not severe enough to qualify for prolia likely (needs -2.5 and fragility fracture). Repeat in 2 years- focus on 1200mg  calcium mainly through diet. Recheck in 2 years. " She is on calcium and vitamin D still- we agreed to update DEXA and try for prolia if -2.5 or worse t score.

## 2017-06-23 ENCOUNTER — Other Ambulatory Visit (INDEPENDENT_AMBULATORY_CARE_PROVIDER_SITE_OTHER): Payer: PPO

## 2017-06-23 DIAGNOSIS — E785 Hyperlipidemia, unspecified: Secondary | ICD-10-CM | POA: Diagnosis not present

## 2017-06-23 LAB — COMPREHENSIVE METABOLIC PANEL
ALT: 14 U/L (ref 0–35)
AST: 17 U/L (ref 0–37)
Albumin: 4.4 g/dL (ref 3.5–5.2)
Alkaline Phosphatase: 53 U/L (ref 39–117)
BUN: 8 mg/dL (ref 6–23)
CO2: 29 meq/L (ref 19–32)
Calcium: 9.8 mg/dL (ref 8.4–10.5)
Chloride: 99 mEq/L (ref 96–112)
Creatinine, Ser: 0.68 mg/dL (ref 0.40–1.20)
GFR: 89.45 mL/min (ref >60.00)
GLUCOSE: 93 mg/dL (ref 70–99)
POTASSIUM: 4.8 meq/L (ref 3.5–5.1)
SODIUM: 135 meq/L (ref 135–145)
Total Bilirubin: 0.6 mg/dL (ref 0.2–1.2)
Total Protein: 6.9 g/dL (ref 6.0–8.3)

## 2017-06-23 LAB — LIPID PANEL
CHOL/HDL RATIO: 2
Cholesterol: 181 mg/dL (ref 0–200)
HDL: 80.5 mg/dL (ref >39.00)
LDL Cholesterol: 89 mg/dL (ref 0–99)
NONHDL: 100.76
Triglycerides: 57 mg/dL (ref 0.0–149.0)
VLDL: 11.4 mg/dL (ref 0.0–40.0)

## 2017-06-24 NOTE — Progress Notes (Signed)
Your CMET was normal (kidney, liver, and electrolytes, blood sugar).  Your cholesterol  Looks great

## 2017-06-28 ENCOUNTER — Telehealth: Payer: Self-pay | Admitting: Radiology

## 2017-06-28 NOTE — Telephone Encounter (Signed)
See note

## 2017-06-28 NOTE — Telephone Encounter (Signed)
Butch Penny could you please contact the patient and look into this for Dr. Ansel Bong team due to their POD all being gone for the afternoon?

## 2017-06-28 NOTE — Telephone Encounter (Signed)
Please advise 

## 2017-06-28 NOTE — Telephone Encounter (Signed)
Dr. Yong Channel, pt coming in tomorrow morning at 8:00 AM.

## 2017-06-28 NOTE — Telephone Encounter (Signed)
Pt is calling to check the status and is awating a call back from Dr Yong Channel or his nurse.

## 2017-06-28 NOTE — Telephone Encounter (Signed)
Spoke to pt, told her received message that you had an episode of cold sweat and nausea. Pt said yes. Asked pt how long did it last. Pt said nausea is just subsiding now and cold sweat lasted about 10 seconds. Asked pt if she was having and chest pain or SOB with episode? Pt said no, denies chest pain or SOB. Told pt Dr. Yong Channel is gone for the day will return tomorrow. I will give the message to him and see how he wants to proceed. I saw he said in his last note that if you had an episode he may order Holter Monitor to assess. Pt said yes he discussed it but she wants to know what he thinks and [place] Told pt okay I send message to him and someone will get back to you tomorrow. Pt verbalized understanding. Told pt if she develops any Chest pain, SOB or pain down left arm need to go to the ED. Pt verbalized understanding.

## 2017-06-28 NOTE — Telephone Encounter (Signed)
Spoke to pt, told her Dr. Yong Channel responded, he said  2 options here- I can set her up with a 30 day heart monitor or we could sit down to discuss tomorrow morning (I would be ok with 8 AM appointment work in tomorrow but would likely still order monitor. [place]  She could also try to get to ER with someone's help (she shouldn't drive with this) to get immediate EKG in future if recurs or at our office if we could get her in. Pt verbalized understanding and said she would like to come in tomorrow morning to discuss with Dr. Yong Channel. Told pt okay we will see you then, appointment schedule for 8:00 AM with Dr. Yong Channel. Pt verbalized understanding. Kara scheduled appt for pt.

## 2017-06-28 NOTE — Telephone Encounter (Signed)
Copied from [place] [phone]. Topic: General - Other >> Jun 28, 2017 12:26 PM Lennox Solders wrote: Reason for CRM: pt saw dr hunter 06-20-17 and per pt was told to call dr hunter if she has another episode of breaking out in cold sweat and nausea. Pt just had another episode about 2 hours ago. Please advise

## 2017-06-28 NOTE — Telephone Encounter (Signed)
2 options here- I can set her up with a 30 day heart monitor or we could sit down to discuss tomorrow morning (I would be ok with 8 AM appointment work in tomorrow but would likely still order monitor. [place]   She could also try to get to ER with someone's help (she shouldn't drive with this) to get immediate EKG in future if recurs or at our office if we could get her in.

## 2017-06-28 NOTE — Telephone Encounter (Signed)
Dr. Yong Channel, please see message. Pt wants to know what you think and what to do?

## 2017-06-29 ENCOUNTER — Ambulatory Visit (INDEPENDENT_AMBULATORY_CARE_PROVIDER_SITE_OTHER): Payer: PPO | Admitting: Family Medicine

## 2017-06-29 ENCOUNTER — Encounter: Payer: Self-pay | Admitting: Family Medicine

## 2017-06-29 VITALS — BP 112/64 | HR 79 | Temp 97.9°F | Ht 63.5 in | Wt 134.0 lb

## 2017-06-29 DIAGNOSIS — L749 Eccrine sweat disorder, unspecified: Secondary | ICD-10-CM | POA: Diagnosis not present

## 2017-06-29 DIAGNOSIS — R110 Nausea: Secondary | ICD-10-CM

## 2017-06-29 DIAGNOSIS — R42 Dizziness and giddiness: Secondary | ICD-10-CM

## 2017-06-29 DIAGNOSIS — I491 Atrial premature depolarization: Secondary | ICD-10-CM | POA: Diagnosis not present

## 2017-06-29 NOTE — Progress Notes (Signed)
Subjective:  Ruth Wilson is a 77 y.o. year old very pleasant female patient who presents for/with See problem oriented charting ROS- had period of dizziness, sweats, nausea yesterday without chest pain or shortness of breath or palpitations.    Past Medical History-  Patient Active Problem List   Diagnosis Date Noted  . Dizzy spells 06/20/2017    Priority: High  . Hyperlipidemia, unspecified 09/30/2013    Priority: Medium  . Osteopenia 10/17/2006    Priority: Medium  . Advanced directives, counseling/discussion 10/30/2015    Priority: Low  . Hip pain, bilateral 04/30/2015    Priority: Low  . Hemorrhoids 09/30/2013    Priority: Low  . Allergy to cats 09/30/2013    Priority: Low  . Family hx of colon cancer 04/30/2012    Priority: Low  . GERD 02/09/2010    Priority: Low  . INSOMNIA-SLEEP DISORDER-UNSPEC 02/09/2010    Priority: Low    Medications- reviewed and updated Current Outpatient Medications  Medication Sig Dispense Refill  . Albuterol Sulfate (PROAIR RESPICLICK) 149 (90 Base) MCG/ACT AEPB Inhale 2 puffs into the lungs every 6 (six) hours as needed (wheezing or shortness of breath with cat exposure). 1 each 1  . Cholecalciferol (VITAMIN D-3 PO) Take 2,000 Units by mouth daily.    . Coenzyme Q10 (CO Q 10 PO) Take 1 capsule by mouth.     . Cyanocobalamin (VITAMIN B-12) 1000 MCG SUBL Place 1,000 mcg under the tongue.    . hydrocortisone (ANUSOL-HC) 25 MG suppository Place 1 suppository (25 mg total) rectally at bedtime. Use 1 suppository at bedtime for 10 days then as needed. 12 suppository 0  . Ibuprofen-Diphenhydramine Cit (ADVIL PM PO) Take by mouth.    . psyllium (METAMUCIL) 58.6 % powder Take 1 packet 3 (three) times daily by mouth.    . TURMERIC PO Take 1 Dose by mouth daily.     No current facility-administered medications for this visit.    Objective: BP 112/64 (BP Location: Left Arm, Patient Position: Sitting, Cuff Size: Large)   Pulse 79   Temp 97.9 F  (36.6 C) (Oral)   Ht 5' 3.5" (1.613 m)   Wt 134 lb (60.8 kg)   SpO2 97%   BMI 23.36 kg/m  Gen: NAD, resting comfortably, well appearing CV: RRR no murmurs rubs or gallops Lungs: CTAB no crackles, wheeze, rhonchi Abdomen: soft/nontender/nondistended/normal bowel sounds.  Ext: no edema Skin: warm, dry  EKG- sinus rhythm with rate of 74 with 1 PAC noted, normal axis, normal intervals, no hypertrophy, no St or t wave changes.   Assessment/Plan:  Dizzy spells S: yesterday  Had already worked out that morning (before workout- half a container of yogurt, fresh blueberries) then came home and had apricots. After that- was helping a friend unpacking- about an hour later stomach felt off and felt nauseous. She grabbed a cookie and a diet coke and had to sit down. She had some sweating but not as severe as previous. Felt dizzy with this. Stayed another hour helping and just felt completely exhausted. Came home and just sat around and took a nap.  No headache  No shortness of breath or chest pain with episodes  Checked BP 2-3 hours after episode and was around 702 systolic.   Once again no issues while working out- if happens - happens a few hours later.  A/P: Dizzy spells with nausea and sweating about once a month. I do think CBG could be cause- taught patient how to check  at home so she can monitor. Also want to know HR and BP at time of episodes- asked her to check immediately when she starts feeling this way. Also asked her to seek care immediately either here or at ER to see if we can capture event. Could be arrhythmia related- will get 30 day event monitor after she returns from [place] from  5th of July until 12th. Will be home for a while after lake.  Will also get echocardiogram given dizziness and PAC on EKG today. Finally if BP were high would do pheochromocytoma testing.   She does have family history CAD in dad- I do not think this represents CAD but if above workup unrevealing or shows  wall motion abnormality on echo would refer to cardiology  Doubt cardiac- never happens with her more intense exercise. Usually at random though several times has been a few hours after exercise.   Lab/Order associations: Dizziness - Plan: EKG 12-Lead, ECHOCARDIOGRAM COMPLETE, Cardiac event monitor  PAC (premature atrial contraction) - Plan: ECHOCARDIOGRAM COMPLETE, Cardiac event monitor  Return precautions advised.  Garret Reddish, MD

## 2017-06-29 NOTE — Patient Instructions (Addendum)
EKG looks ok today- there is one early beat called a premature atrial contraction which is not dangerous   We will call you within two weeks about your referral for echocardiogram. If you do not hear within 3 weeks, give [place] a call. You will also get a call about your heart monitor for 30 days- to be set up for when you return from trip  Next time you get to feeling like this- I want you to check your blood pressure, blood sugar, and heart rate and record all 3  If you have a recurrent episode- come in here immediately- obviously have have husband drive you. If I am not here- you will be directed to emergency room most likely.   I would also like for you to sign up for an annual wellness visit with one of our nurses, Cassie or Manuela Schwartz, who both specialize in the annual wellness visit. This is a free benefit under medicare that may help [place] find additional ways to help you. Some highlights are reviewing medications, lifestyle, and doing a dementia screen.

## 2017-06-29 NOTE — Assessment & Plan Note (Signed)
S: yesterday  Had already worked out that morning (before workout- half a container of yogurt, fresh blueberries) then came home and had apricots. After that- was helping a friend unpacking- about an hour later stomach felt off and felt nauseous. She grabbed a cookie and a diet coke and had to sit down. She had some sweating but not as severe as previous. Felt dizzy with this. Stayed another hour helping and just felt completely exhausted. Came home and just sat around and took a nap.  No headache  No shortness of breath or chest pain with episodes  Checked BP 2-3 hours after episode and was around 709 systolic.   Once again no issues while working out- if happens - happens a few hours later.  A/P: Dizzy spells with nausea and sweating about once a month. I do think CBG could be cause- taught patient how to check at home so she can monitor. Also want to know HR and BP at time of episodes- asked her to check immediately when she starts feeling this way. Also asked her to seek care immediately either here or at ER to see if we can capture event. Could be arrhythmia related- will get 30 day event monitor after she returns from [place] from  5th of July until 12th. Will be home for a while after lake.  Will also get echocardiogram given dizziness and PAC on EKG today. Finally if BP were high would do pheochromocytoma testing.   She does have family history CAD in dad- I do not think this represents CAD but if above workup unrevealing or shows wall motion abnormality on echo would refer to cardiology

## 2017-07-04 ENCOUNTER — Ambulatory Visit (HOSPITAL_COMMUNITY): Payer: PPO | Attending: Cardiovascular Disease

## 2017-07-04 ENCOUNTER — Other Ambulatory Visit: Payer: Self-pay

## 2017-07-04 DIAGNOSIS — I071 Rheumatic tricuspid insufficiency: Secondary | ICD-10-CM | POA: Insufficient documentation

## 2017-07-04 DIAGNOSIS — R42 Dizziness and giddiness: Secondary | ICD-10-CM | POA: Diagnosis not present

## 2017-07-04 DIAGNOSIS — Z8249 Family history of ischemic heart disease and other diseases of the circulatory system: Secondary | ICD-10-CM | POA: Insufficient documentation

## 2017-07-04 DIAGNOSIS — E785 Hyperlipidemia, unspecified: Secondary | ICD-10-CM | POA: Diagnosis not present

## 2017-07-04 DIAGNOSIS — I491 Atrial premature depolarization: Secondary | ICD-10-CM | POA: Diagnosis not present

## 2017-08-01 ENCOUNTER — Encounter: Payer: Self-pay | Admitting: Family Medicine

## 2017-08-02 ENCOUNTER — Other Ambulatory Visit: Payer: Self-pay

## 2017-08-02 DIAGNOSIS — R42 Dizziness and giddiness: Secondary | ICD-10-CM

## 2017-08-02 DIAGNOSIS — I491 Atrial premature depolarization: Secondary | ICD-10-CM

## 2017-08-09 ENCOUNTER — Ambulatory Visit (INDEPENDENT_AMBULATORY_CARE_PROVIDER_SITE_OTHER)
Admission: RE | Admit: 2017-08-09 | Discharge: 2017-08-09 | Disposition: A | Discharge status: Home / Self Care | Payer: PPO | Source: Ambulatory Visit | Attending: Family Medicine | Admitting: Family Medicine

## 2017-08-09 DIAGNOSIS — M85851 Other specified disorders of bone density and structure, right thigh: Secondary | ICD-10-CM | POA: Diagnosis not present

## 2017-08-24 ENCOUNTER — Ambulatory Visit (INDEPENDENT_AMBULATORY_CARE_PROVIDER_SITE_OTHER): Payer: PPO

## 2017-08-24 DIAGNOSIS — I491 Atrial premature depolarization: Secondary | ICD-10-CM

## 2017-08-24 DIAGNOSIS — R42 Dizziness and giddiness: Secondary | ICD-10-CM

## 2017-08-29 NOTE — Progress Notes (Signed)
Called pt back after notifying her of her bone density scan results.  Wanted to confirm with patient that she is taking 1,000-1,200 mg of Calcium daily as well as 1,000 iu of Vitamin D daily.  Patient stated she takes Vit D 1000 iu daily and a chewable Calcium supplement, 1000mg  daily.

## 2017-10-03 ENCOUNTER — Other Ambulatory Visit: Payer: Self-pay

## 2017-10-03 DIAGNOSIS — I493 Ventricular premature depolarization: Secondary | ICD-10-CM

## 2017-10-04 ENCOUNTER — Telehealth: Payer: Self-pay | Admitting: Family Medicine

## 2017-10-04 NOTE — Telephone Encounter (Signed)
See note  Copied from [place] [id number]. Topic: Quick Communication - Office Called Patient >> Oct 04, 2017 10:35 AM Ivar Drape wrote: Reason for CRM:  Patient was returning Jamie's call.  Please return call.

## 2017-10-05 ENCOUNTER — Encounter: Payer: Self-pay | Admitting: Family Medicine

## 2017-10-05 NOTE — Telephone Encounter (Signed)
Pt is returning jamie call °

## 2017-10-09 ENCOUNTER — Encounter: Payer: Self-pay | Admitting: Family Medicine

## 2017-10-09 ENCOUNTER — Ambulatory Visit (INDEPENDENT_AMBULATORY_CARE_PROVIDER_SITE_OTHER): Payer: PPO

## 2017-10-09 DIAGNOSIS — Z23 Encounter for immunization: Secondary | ICD-10-CM

## 2017-10-09 NOTE — Telephone Encounter (Signed)
Spoke with patient on Friday.

## 2017-10-10 ENCOUNTER — Telehealth: Payer: Self-pay | Admitting: *Deleted

## 2017-10-10 NOTE — Telephone Encounter (Signed)
Copied from [place] [id number]. Topic: Quick Communication - See Telephone Encounter >> Oct 10, 2017  2:18 PM Hewitt Shorts wrote: Pt is needing to get the name of the other medication for osteoperosis she does not like the side effects on prolia and would like to discuss a change with dr. Jonelle Sidle number [phone]

## 2017-10-13 NOTE — Telephone Encounter (Signed)
Spoke to pt, asked her what questions she has about other medications. Pt said she read all info about Prolia and is has a lot of side effects and would like info on Reclast so she can decide which is the best way to go. Told pt I do not know much about Reclast but I will found some information and mail to you. Pt verbalized understanding. Ruth Wilson printed out up to date info on Reclast for pt. Mailed to pt.

## 2017-10-24 ENCOUNTER — Encounter: Payer: Self-pay | Admitting: Cardiology

## 2017-11-06 NOTE — Progress Notes (Signed)
Referring-Stephen Yong Channel, MD Reason for referral-dizziness  HPI: 77 year old female for evaluation of dizziness at request of Garret Reddish, MD.  Echocardiogram June 2019 showed normal LV systolic function, mild diastolic dysfunction, moderate left atrial enlargement, mild right atrial enlargement.  Holter monitor August 2019 showed sinus bradycardia to sinus tachycardia with frequent PVCs.  I have personally reviewed the monitor and it shows sinus with occasional PACs, PVCs and 6 as well as 7 beat run of nonsustained ventricular tachycardia.  Laboratories June 2019 showed potassium 4.8 and normal renal function.  TSH normal April 2019.  Patient states that from February through May she had 8-9 episodes of dizziness.  These were sudden in onset.  She would initially feel queasy followed by a hot sensation and sweatiness.  No associated chest pain, palpitations or dyspnea.  She then would feel generalized weakness.  She denies syncope.  Her symptoms would improve after sitting down but she would feel queasy for 1 to 2 days and then would feel better.  She has not had any episodes since May.  Otherwise denies dyspnea on exertion, orthopnea, PND, pedal edema, exertional chest pain or syncope.  Cardiology asked to evaluate.  Current Outpatient Medications  Medication Sig Dispense Refill  . Albuterol Sulfate (PROAIR RESPICLICK) 917 (90 Base) MCG/ACT AEPB Inhale 2 puffs into the lungs every 6 (six) hours as needed (wheezing or shortness of breath with cat exposure). 1 each 1  . Calcium Carbonate-Vit D-Min (CALCIUM 1200 PO) Take 1,200 Units by mouth daily.    . Cholecalciferol (VITAMIN D-3 PO) Take 2,000 Units by mouth daily.    . Coenzyme Q10 (CO Q 10 PO) Take 1 capsule by mouth.     . Cyanocobalamin (VITAMIN B-12) 1000 MCG SUBL Place 1,000 mcg under the tongue.    . Ibuprofen-Diphenhydramine Cit (ADVIL PM PO) Take by mouth.    . psyllium (METAMUCIL) 58.6 % powder Take 1 packet by mouth as needed.       . TURMERIC PO Take 1 Dose by mouth daily.     No current facility-administered medications for this visit.     Allergies  Allergen Reactions  . Alendronate Sodium     REACTION: GI problems fosamax  . Risedronate Sodium     REACTION: GI problems boniva     Past Medical History:  Diagnosis Date  . Allergy    cat dander and seasonal  . Cataract   . Diverticulosis of colon without hemorrhage 04/30/2012  . Osteopenia     Past Surgical History:  Procedure Laterality Date  . cataract surgery     tattoo on eye due to damaged iris  . COLONOSCOPY    . OOPHORECTOMY     bilateral  . POLYPECTOMY    . TONSILLECTOMY    . TOTAL ABDOMINAL HYSTERECTOMY      Social History   Socioeconomic History  . Marital status: Married    Spouse name: Not on file  . Number of children: 3  . Years of education: Not on file  . Highest education level: Not on file  Occupational History  . Occupation: Retired    Fish farm manager: RETIRED  Social Needs  . Financial resource strain: Not on file  . Food insecurity:    Worry: Not on file    Inability: Not on file  . Transportation needs:    Medical: Not on file    Non-medical: Not on file  Tobacco Use  . Smoking status: Never Smoker  . Smokeless tobacco: Never  Used  Substance and Sexual Activity  . Alcohol use: Yes    Alcohol/week: 14.0 standard drinks    Types: 14 Standard drinks or equivalent per week    Comment: advised to cut to 1 a day-red wine  . Drug use: No  . Sexual activity: Not on file  Lifestyle  . Physical activity:    Days per week: Not on file    Minutes per session: Not on file  . Stress: Not on file  Relationships  . Social connections:    Talks on phone: Not on file    Gets together: Not on file    Attends religious service: Not on file    Active member of club or organization: Not on file    Attends meetings of clubs or organizations: Not on file    Relationship status: Not on file  . Intimate partner violence:     Fear of current or ex partner: Not on file    Emotionally abused: Not on file    Physically abused: Not on file    Forced sexual activity: Not on file  Other Topics Concern  . Not on file  Social History Narrative   Married 51 years in 2015. 3 children. Stacy lives in [place] middle divorced with 3 boys, Clair Gulling oldest living in [place] has 6 children and is a Restaurant manager, fast food, Gershon Mussel lives in [place] no children but 4 dogs.       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 Heart disease Mother        MI age 76 but lived to 64  . Colon cancer Father        died 28 from colon cancer    ROS: no fevers or chills, productive cough, hemoptysis, dysphasia, odynophagia, melena, hematochezia, dysuria, hematuria, rash, seizure activity, orthopnea, PND, pedal edema, claudication. Remaining systems are negative.  Physical Exam:   Blood pressure 138/74, pulse 76, height 5\' 4"  (1.626 m), weight 136 lb (61.7 kg).  General:  Well developed/well nourished in NAD Skin warm/dry Patient not depressed No peripheral clubbing Back-normal HEENT-normal/normal eyelids Neck supple/normal carotid upstroke bilaterally; no bruits; no JVD; no thyromegaly chest - CTA/ normal expansion CV - RRR/normal S1 and S2; no murmurs, rubs or gallops;  PMI nondisplaced Abdomen -NT/ND, no HSM, no mass, + bowel sounds, no bruit 2+ femoral pulses, no bruits Ext-no edema, chords, 2+ DP Neuro-grossly nonfocal  ECG -June 29, 2017-sinus rhythm with occasional PAC.  Personally reviewed  A/P  1 dizziness-etiology unclear.  Initial description sounded potentially to be vagal in etiology.  However she remained "queasy" for 1 to 2 days afterwards which would be less convincing for vagal episode.  She did wear a monitor that showed PVCs and nonsustained ventricular tachycardia but had no symptoms associated.  I am not convinced this is a  rhythm disturbance but if she has more episodes in the future I have recommended alive core.  I will see her back in 6 months.  2 nonsustained ventricular tachycardia-patient had no symptoms and LV function normal.  Potassium also normal.  She would like to avoid medications and I think this is reasonable.  If she has problems in the future could consider addition of beta-blocker.  Kirk Ruths, MD

## 2017-11-09 ENCOUNTER — Encounter: Payer: Self-pay | Admitting: Cardiology

## 2017-11-09 ENCOUNTER — Ambulatory Visit (INDEPENDENT_AMBULATORY_CARE_PROVIDER_SITE_OTHER): Payer: PPO | Admitting: Cardiology

## 2017-11-09 VITALS — BP 138/74 | HR 76 | Ht 64.0 in | Wt 136.0 lb

## 2017-11-09 DIAGNOSIS — R42 Dizziness and giddiness: Secondary | ICD-10-CM

## 2017-11-09 DIAGNOSIS — I472 Ventricular tachycardia: Secondary | ICD-10-CM | POA: Diagnosis not present

## 2017-11-09 DIAGNOSIS — I4729 Other ventricular tachycardia: Secondary | ICD-10-CM

## 2017-11-09 NOTE — Patient Instructions (Signed)
Medication Instructions:  Continue same medications If you need a refill on your cardiac medications before your next appointment, please call your pharmacy.   Lab work: None ordered   Testing/Procedures: None ordered  Follow-Up: At [facility], you and your health needs are our priority.  As part of our continuing mission to provide you with exceptional heart care, we have created designated Provider Care Teams.  These Care Teams include your primary Cardiologist (physician) and Advanced Practice Providers (APPs -  Physician Assistants and Nurse Practitioners) who all work together to provide you with the care you need, when you need it. . Follow Up with Dr.Crenshaw in 6 months call 2 months before to schedule    

## 2018-01-15 ENCOUNTER — Other Ambulatory Visit: Payer: Self-pay | Admitting: Family Medicine

## 2018-01-15 DIAGNOSIS — Z1231 Encounter for screening mammogram for malignant neoplasm of breast: Secondary | ICD-10-CM

## 2018-02-01 DIAGNOSIS — D225 Melanocytic nevi of trunk: Secondary | ICD-10-CM | POA: Diagnosis not present

## 2018-02-01 DIAGNOSIS — L570 Actinic keratosis: Secondary | ICD-10-CM | POA: Diagnosis not present

## 2018-02-01 DIAGNOSIS — L821 Other seborrheic keratosis: Secondary | ICD-10-CM | POA: Diagnosis not present

## 2018-02-02 ENCOUNTER — Ambulatory Visit (INDEPENDENT_AMBULATORY_CARE_PROVIDER_SITE_OTHER): Payer: PPO | Admitting: Family Medicine

## 2018-02-02 ENCOUNTER — Ambulatory Visit: Payer: Self-pay

## 2018-02-02 ENCOUNTER — Encounter: Payer: Self-pay | Admitting: Family Medicine

## 2018-02-02 VITALS — BP 145/81 | HR 84 | Temp 97.7°F | Ht 64.0 in | Wt 136.2 lb

## 2018-02-02 DIAGNOSIS — G909 Disorder of the autonomic nervous system, unspecified: Secondary | ICD-10-CM

## 2018-02-02 NOTE — Telephone Encounter (Signed)
Scheduled with Orma Flaming this afternoon at 1:40pm

## 2018-02-02 NOTE — Progress Notes (Signed)
Patient: Ruth Wilson MRN: [id number] DOB: [birth year] PCP: Marin Olp, MD     Subjective:  Chief Complaint  Patient presents with  . Dizziness    on and off x 1 yr    HPI: The patient is a 78 y.o. female who presents today for episodes of light headedness, diaphoresis, nausea and weakness which has been an ongoing issue. Started in April of 2019. Episodes are the same. Has seen her PCP twice for this and was seen by cardiology in October. Has had a few EKGs, echo and long term monitor. Echocardiogram June 2019 showed normal LV systolic function, mild diastolic dysfunction, moderate left atrial enlargement, mild right atrial enlargement.  Holter monitor August 2019 showed sinus bradycardia to sinus tachycardia with frequent PVCs. She had no symptoms during the PVCs. Was sent to cardiology and wasn't thought to be cardiac in nature with unclear etiology. F/u with them in 6 months.  Has never seen ENT. She states her symptoms started this morning when was getting ready and all of a sudden she feels a warmth and feels like she could throw up, but doesn't and then breaks out in a cold sweat and then she is weak with a wave of light headedness, not dizziness. She sits and feels better, but her stomach still feels quesy. Has not had these symptoms in one month and before this it was six months. Symptoms have always been the same. She had eaten this morning (cereal/blueberries/milk) and her coffee. She had sat on the toilet and thinks she had a BM before this episode started.   The other times this has happened she has been on her feet for a long time and she will get this feeling. Does not feel like her heart is beating fast and doesn't seem to be correlated with turning her head from side to side. No vision changes, tinnitus. No recent viral illness and no hearing loss.  Does not recall if in heat or post prandial. Denies any focal deficits, confusion with the episodes.   Review of Systems   Constitutional: Negative for fever.  HENT: Negative for congestion, ear pain, hearing loss and tinnitus.   Eyes: Negative for visual disturbance.  Respiratory: Negative for shortness of breath.   Cardiovascular: Negative for chest pain, palpitations and leg swelling.  Gastrointestinal: Negative for abdominal pain, nausea and vomiting.  Musculoskeletal: Negative for arthralgias.  Skin: Negative for rash.  Neurological: Positive for light-headedness. Negative for dizziness, tremors, syncope, facial asymmetry, speech difficulty, numbness and headaches.    Allergies Patient is allergic to alendronate sodium and risedronate sodium.  Past Medical History Patient  has a past medical history of Allergy, Cataract, Diverticulosis of colon without hemorrhage (04/30/2012), and Osteopenia.  Surgical History Patient  has a past surgical history that includes Oophorectomy; cataract surgery; Colonoscopy; Polypectomy; Tonsillectomy; and Total abdominal hysterectomy.  Family History Pateint's family history includes Colon cancer in her father; Heart disease in her mother.  Social History Patient  reports that she has never smoked. She has never used smokeless tobacco. She reports current alcohol use of about 14.0 standard drinks of alcohol per week. She reports that she does not use drugs.    Objective: Vitals:   02/02/18 1330  BP: (!) 145/81  Pulse: 84  Temp: 97.7 F (36.5 C)  TempSrc: Oral  SpO2: 100%  Weight: 136 lb 3.2 oz (61.8 kg)  Height: 5\' 4"  (1.626 m)    Body mass index is 23.38 kg/m.  Physical Exam Vitals  signs reviewed.  Constitutional:      Appearance: Normal appearance.  HENT:     Head: Normocephalic and atraumatic.     Right Ear: Tympanic membrane, ear canal and external ear normal.     Left Ear: Tympanic membrane, ear canal and external ear normal.  Eyes:     Extraocular Movements: Extraocular movements intact.     Pupils: Pupils are equal, round, and reactive to  light.     Comments: Cataract lenses bilaterally with tattoo on left lens  Neck:     Musculoskeletal: Normal range of motion and neck supple.  Cardiovascular:     Rate and Rhythm: Normal rate and regular rhythm.     Heart sounds: Normal heart sounds.  Pulmonary:     Effort: Pulmonary effort is normal.     Breath sounds: Normal breath sounds.  Abdominal:     General: Abdomen is flat. Bowel sounds are normal.     Palpations: Abdomen is soft.  Lymphadenopathy:     Cervical: No cervical adenopathy.  Neurological:     General: No focal deficit present.     Mental Status: She is alert and oriented to person, place, and time.     Cranial Nerves: No cranial nerve deficit.     Sensory: No sensory deficit.     Motor: No weakness.     Coordination: Coordination normal.     Deep Tendon Reflexes: Reflexes normal.     Comments: Negative dix halpike   Psychiatric:        Mood and Affect: Mood normal.        Behavior: Behavior normal.       Orthostatic VS for the past 24 hrs:  BP- Lying Pulse- Lying BP- Sitting Pulse- Sitting BP- Standing at 0 minutes Pulse- Standing at 0 minutes  02/02/18 1340 145/79 69 145/81 84 120/79 84     Assessment/plan: 1. Autonomic dysfunction She meets criteria for orthostatic hypotension. She is on no medication to cause autonomic dysfunction, has no known neurological disorder. Does not meet any criteria for POTS. Discussed tilt testing may be helpful and will refer to Dr. Jens Som for this. She does have enlarged atria on echo which I wonder could affect vagal nodal receptors or if  this is due to age. She has had echo, monitor, labs, etc. Done. She doesn't have true dizziness/vertigo and episodes more consistent with vagal type issues. Will send to Dr. Jens Som and defer to PCP for further work up if indicated.  - Ambulatory referral to Cardiology   >25 minutes spent face to face and in counseling/management.   Return if symptoms worsen or fail to  improve.     Orma Flaming, MD [place]  02/02/2018

## 2018-02-02 NOTE — Telephone Encounter (Signed)
See note

## 2018-02-02 NOTE — Telephone Encounter (Signed)
  Patient called in with c/o "dizziness." She says "I've had this before and Dr. Yong Channel wanted me to call and let him know that this happened again and he wants to see me while I'm in the middle of this. I woke up this morning and around [id number], I got dizzy, nauseated, sweating. I sat down and it passed, but now I feel weak and still feel off. I checked my BP and it was 111/68, P 66." I asked about other symptoms, she says "just nausea and sweating. I also am feeling weak and tired, still off feeling." According to protocol, see PCP within 3 days. I called the office and spoke with the [facility] who says to send this message high priority and she will get it to Dr. Ansel Bong nurse. I advised the patient to expect a call from the office with his recommendations.   Reason for Disposition . [1] MODERATE dizziness (e.g., interferes with normal activities) AND [2] has been evaluated by physician for this  Answer Assessment - Initial Assessment Questions 1. DESCRIPTION: "Describe your dizziness."     Dizzy, off feeling 2. LIGHTHEADED: "Do you feel lightheaded?" (e.g., somewhat faint, woozy, weak upon standing)     Yes 3. VERTIGO: "Do you feel like either you or the room is spinning or tilting?" (i.e. vertigo)     No 4. SEVERITY: "How bad is it?"  "Do you feel like you are going to faint?" "Can you stand and walk?"   - MILD - walking normally   - MODERATE - interferes with normal activities (e.g., work, school)    - SEVERE - unable to stand, requires support to walk, feels like passing out now.      Moderate 5. ONSET:  "When did the dizziness begin?"     8-815 this morning 6. AGGRAVATING FACTORS: "Does anything make it worse?" (e.g., standing, change in head position)     It's just there 7. HEART RATE: "Can you tell me your heart rate?" "How many beats in 15 seconds?"  (Note: not all patients can do this)       66, 67 8. CAUSE: "What do you think is causing the dizziness?"     I don't know 9. RECURRENT  SYMPTOM: "Have you had dizziness before?" If so, ask: "When was the last time?" "What happened that time?"     Yes about 6 months ago 10. OTHER SYMPTOMS: "Do you have any other symptoms?" (e.g., fever, chest pain, vomiting, diarrhea, bleeding)       Nausea, sweating 11. PREGNANCY: "Is there any chance you are pregnant?" "When was your last menstrual period?"       No  Protocols used: DIZZINESS [facility]

## 2018-02-05 ENCOUNTER — Encounter: Payer: Self-pay | Admitting: Family Medicine

## 2018-02-05 NOTE — Telephone Encounter (Signed)
See note  Copied from [place] [id number]. Topic: General - Inquiry >> Feb 05, 2018  9:26 AM Conception Chancy, NT wrote: Reason for CRM: patient is calling to follow up on her mychart message from this morning please advise.

## 2018-02-07 NOTE — Telephone Encounter (Signed)
FYI

## 2018-02-14 ENCOUNTER — Ambulatory Visit
Admission: RE | Admit: 2018-02-14 | Discharge: 2018-02-14 | Disposition: A | Discharge status: Home / Self Care | Payer: PPO | Source: Ambulatory Visit | Attending: Family Medicine | Admitting: Family Medicine

## 2018-02-14 DIAGNOSIS — Z1231 Encounter for screening mammogram for malignant neoplasm of breast: Secondary | ICD-10-CM | POA: Diagnosis not present

## 2018-03-05 DIAGNOSIS — Z961 Presence of intraocular lens: Secondary | ICD-10-CM | POA: Diagnosis not present

## 2018-03-15 ENCOUNTER — Institutional Professional Consult (permissible substitution): Payer: PPO | Admitting: Internal Medicine

## 2018-03-22 ENCOUNTER — Ambulatory Visit: Payer: PPO | Admitting: Internal Medicine

## 2018-03-22 ENCOUNTER — Encounter: Payer: Self-pay | Admitting: Internal Medicine

## 2018-03-22 ENCOUNTER — Other Ambulatory Visit: Payer: Self-pay

## 2018-03-22 VITALS — BP 148/77 | HR 71 | Ht 64.0 in | Wt 138.6 lb

## 2018-03-22 DIAGNOSIS — R42 Dizziness and giddiness: Secondary | ICD-10-CM

## 2018-03-22 DIAGNOSIS — I472 Ventricular tachycardia: Secondary | ICD-10-CM

## 2018-03-22 DIAGNOSIS — I4729 Other ventricular tachycardia: Secondary | ICD-10-CM

## 2018-03-22 NOTE — Progress Notes (Signed)
ELECTROPHYSIOLOGY CONSULT NOTE  Patient ID: Ruth Wilson, MRN: [id number], DOB/AGE: [birth year] 78 y.o. Admit date: (Not on file) Date of Consult: 03/22/2018  Primary Physician: Marin Olp, MD Primary Cardiologist: [facility]     Ruth Wilson is a 78 y.o. female who is being seen today for the evaluation of presyncope at the request of Dr Eliberto Ivory.    HPI Ruth Wilson is a 78 y.o. female with a one-year history of recurrent abrupt onset spells.  They are stereotypical and characterized by nausea diaphoresis heat presyncope and profound recovery fatigue that lasts hours.  She has had no associated palpitations.  The first of these episodes occurred while seated in a car having gone to the gym.  A second episode occurred while at the beach.  A third episode occurred while out walking with a friend.  The fourth episode occurred while walking at the house show and having stopped.  Typically resolved on their own over minutes.  No prior history of spells.  Does have a history of some fluttering.  She was given an event recorder that demonstrated nonsustained ventricular tachycardia, nonsustained atrial tachycardia as well as frequent PACs.  Echocardiogram was normal  The patient denies chest pain, shortness of breath, nocturnal dyspnea, orthopnea or peripheral edema.  There have been no palpitations, lightheadedness or syncope.   Past Medical History:  Diagnosis Date  . Allergy    cat dander and seasonal  . Cataract   . Diverticulosis of colon without hemorrhage 04/30/2012  . Osteopenia       Surgical History:  Past Surgical History:  Procedure Laterality Date  . cataract surgery     tattoo on eye due to damaged iris  . COLONOSCOPY    . OOPHORECTOMY     bilateral  . POLYPECTOMY    . TONSILLECTOMY    . TOTAL ABDOMINAL HYSTERECTOMY       Home Meds: Current Meds  Medication Sig  . Albuterol Sulfate (PROAIR RESPICLICK) 465 (90 Base) MCG/ACT AEPB Inhale 2  puffs into the lungs every 6 (six) hours as needed (wheezing or shortness of breath with cat exposure).  . Calcium Carbonate-Vit D-Min (CALCIUM 1200 PO) Take 1,200 Units by mouth daily.  . Cholecalciferol (VITAMIN D-3 PO) Take 2,000 Units by mouth daily.  . Coenzyme Q10 (CO Q 10 PO) Take 1 capsule by mouth.   . Cyanocobalamin (VITAMIN B-12) 1000 MCG SUBL Place 1,000 mcg under the tongue.  . Ibuprofen-Diphenhydramine Cit (ADVIL PM PO) Take by mouth.  . TURMERIC PO Take 1 Dose by mouth daily.    Allergies:  Allergies  Allergen Reactions  . Alendronate Sodium     REACTION: GI problems fosamax  . Risedronate Sodium     REACTION: GI problems boniva    Social History   Socioeconomic History  . Marital status: Married    Spouse name: Not on file  . Number of children: 3  . Years of education: Not on file  . Highest education level: Not on file  Occupational History  . Occupation: Retired    Fish farm manager: RETIRED  Social Needs  . Financial resource strain: Not on file  . Food insecurity:    Worry: Not on file    Inability: Not on file  . Transportation needs:    Medical: Not on file    Non-medical: Not on file  Tobacco Use  . Smoking status: Never Smoker  . Smokeless tobacco: Never Used  Substance and Sexual Activity  .  Alcohol use: Yes    Alcohol/week: 14.0 standard drinks    Types: 14 Standard drinks or equivalent per week    Comment: advised to cut to 1 a day-red wine  . Drug use: No  . Sexual activity: Not on file  Lifestyle  . Physical activity:    Days per week: Not on file    Minutes per session: Not on file  . Stress: Not on file  Relationships  . Social connections:    Talks on phone: Not on file    Gets together: Not on file    Attends religious service: Not on file    Active member of club or organization: Not on file    Attends meetings of clubs or organizations: Not on file    Relationship status: Not on file  . Intimate partner violence:    Fear of  current or ex partner: Not on file    Emotionally abused: Not on file    Physically abused: Not on file    Forced sexual activity: Not on file  Other Topics Concern  . Not on file  Social History Narrative   Married 51 years in 2015. 3 children. Stacy lives in [place] middle divorced with 3 boys, Clair Gulling oldest living in [place] has 6 children and is a Restaurant manager, fast food, Gershon Mussel lives in [place] no children but 4 dogs.       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 Heart disease Mother        MI age 67 but lived to 43  . Colon cancer Father        died 64 from colon cancer     ROS:  Please see the history of present illness.     All other systems reviewed and negative.    Physical Exam: Blood pressure (!) 148/77, pulse 71, height 5\' 4"  (1.626 m), weight 138 lb 9.6 oz (62.9 kg), SpO2 98 %. General: Well developed, well nourished female in no acute distress. Head: Normocephalic, atraumatic, sclera non-icteric, no xanthomas, nares are without discharge. EENT: normal  Lymph Nodes:  none Neck: Negative for carotid bruits. JVD not elevated. Back:without scoliosis kyphosis Lungs: Clear bilaterally to auscultation without wheezes, rales, or rhonchi. Breathing is unlabored. Heart: RRR with S1 S2. No  murmur . No rubs, or gallops appreciated. Abdomen: Soft, non-tender, non-distended with normoactive bowel sounds. No hepatomegaly. No rebound/guarding. No obvious abdominal masses. Msk:  Strength and tone appear normal for age. Extremities: No clubbing or cyanosis. No edema.  Distal pedal pulses are 2+ and equal bilaterally. Skin: Warm and Dry Neuro: Alert and oriented X 3. CN III-XII intact Grossly normal sensory and motor function . Psych:  Responds to questions appropriately with a normal affect.      Labs: Cardiac Enzymes No results for input(s): CKTOTAL, CKMB, TROPONINI in the last 72  hours. CBC Lab Results  Component Value Date   WBC 4.6 04/10/2017   HGB 13.0 04/10/2017   HCT 38.7 04/10/2017   MCV 92.0 04/10/2017   PLT 297.0 04/10/2017   PROTIME: No results for input(s): LABPROT, INR in the last 72 hours. Chemistry No results for input(s): NA, K, CL, CO2, BUN, CREATININE, CALCIUM, PROT, BILITOT, ALKPHOS, ALT, AST, GLUCOSE in the last 168 hours.  Invalid input(s): LABALBU Lipids Lab Results  Component Value Date   CHOL 181 06/23/2017  HDL 80.50 06/23/2017   LDLCALC 89 06/23/2017   TRIG 57.0 06/23/2017   BNP No results found for: PROBNP Thyroid Function Tests: No results for input(s): TSH, T4TOTAL, T3FREE, THYROIDAB in the last 72 hours.  Invalid input(s): FREET3 Miscellaneous No results found for: DDIMER  Radiology/Studies:  No results found.  EKG: Sinus at 71 Interval 17/08/39 Axis 74  Event recorder personally reviewed demonstrating PACs-frequent nonsustained atrial tachycardia nonsustained ventricular tachycardia  Assessment and Plan:  Spells probably neurally mediated  PACs-moderately frequent-  Atrial tachycardia-nonsustained  Ventricular tachycardia-nonsustained  Elevated blood pressure     The patient has had recurrent spells with a stereotypical response consistent with a neurally mediated reflex.  With the known nonsustained SVT and VT and a sensation of heat, I wonder if there is an arrhythmic trigger.  Given the infrequency of the events, I suggested that we use an implantable loop recorder to try to ascertain an arrhythmic association.  She is agreeable.  We have discussed the physiology of autonomic reflexes.  Her normal ECG and echo makes life-threatening arrhythmias unlikely; the PVC morphology suggests a outflow tract source    Virl Axe

## 2018-03-22 NOTE — Patient Instructions (Signed)
Medication Instructions:  Your physician recommends that you continue on your current medications as directed. Please refer to the Current Medication list given to you today.  Labwork: None ordered.  Testing/Procedures: None ordered.  Follow-Up: Your physician recommends that you schedule a follow-up appointment   Based off when your Linq is implanted  Any Other Special Instructions Will Be Listed Below (If Applicable).     If you need a refill on your cardiac medications before your next appointment, please call your pharmacy.

## 2018-03-26 ENCOUNTER — Telehealth: Payer: Self-pay | Admitting: Internal Medicine

## 2018-03-26 NOTE — Telephone Encounter (Signed)
Spoke with and explained to pt we are not performing any elective procedures at this time. Office visits are limited to acute cases only in lieu of the coronavirus outbreak. Pt has verbalized understanding and will call back in a few weeks for an update.

## 2018-03-26 NOTE — Telephone Encounter (Signed)
New Message          Patient is calling in today checking on her Linq implant. She states no one has called her to let her know if she will still have this or not. Pls call and advise.

## 2018-03-30 ENCOUNTER — Ambulatory Visit: Payer: Self-pay | Admitting: *Deleted

## 2018-03-30 NOTE — Telephone Encounter (Signed)
Patient is calling COVID19 nurse line for advisement on GI symptoms because they were watching new and GI symptoms were listed with the virus.  Patient is reporting she had lower bowel discomfort yesterday. Patient reports bowels normal- her husband had more severe bowel symptoms- hers have been less severe.  No fever, no respiratory symptom- patient reports- normal allergy symptom, no travel. Discussed COVID19 testing criteria at this point- when to do E visit- can see at office for any illness- they will call if GI symptoms increase.

## 2018-04-02 NOTE — Telephone Encounter (Signed)
Noted! Pt notified of instructions and verbalized understanding.

## 2018-04-02 NOTE — Telephone Encounter (Signed)
FYI  Spoke to pt and she stated that she and her husband are doing just fine.

## 2018-04-02 NOTE — Telephone Encounter (Signed)
That's great news- if they develop fever or cough advise them to get set up for webex/telemedicine visit

## 2018-06-12 ENCOUNTER — Telehealth: Payer: Self-pay

## 2018-06-12 NOTE — Telephone Encounter (Signed)
Called the patient on her mobile number and informed her that I was calling about switching her upcoming appointment to a virtual visit. Patient stated that she would like to cancel due to being out of town and will call the office upon her return to set up an appointment.

## 2018-06-12 NOTE — Telephone Encounter (Signed)
Left a detailed message for the patient about her upcoming appointment and switching it to a virtual visit for the same day and time. Also let her know that a MyChart mesaage was also sent involving this appointment and that she could respond via MyChart or call the office.

## 2018-06-26 ENCOUNTER — Ambulatory Visit: Payer: PPO | Admitting: Cardiology

## 2018-07-30 ENCOUNTER — Telehealth: Payer: Self-pay | Admitting: Cardiology

## 2018-07-30 ENCOUNTER — Emergency Department (HOSPITAL_COMMUNITY)
Admission: EM | Admit: 2018-07-30 | Discharge: 2018-07-30 | Disposition: A | Discharge status: Home / Self Care | Payer: PPO | Attending: Emergency Medicine | Admitting: Emergency Medicine

## 2018-07-30 ENCOUNTER — Encounter (HOSPITAL_COMMUNITY): Payer: Self-pay | Admitting: Emergency Medicine

## 2018-07-30 ENCOUNTER — Emergency Department (HOSPITAL_COMMUNITY): Payer: PPO

## 2018-07-30 ENCOUNTER — Telehealth: Payer: Self-pay | Admitting: Physical Therapy

## 2018-07-30 ENCOUNTER — Other Ambulatory Visit: Payer: Self-pay

## 2018-07-30 DIAGNOSIS — R0902 Hypoxemia: Secondary | ICD-10-CM | POA: Diagnosis not present

## 2018-07-30 DIAGNOSIS — Z20828 Contact with and (suspected) exposure to other viral communicable diseases: Secondary | ICD-10-CM | POA: Insufficient documentation

## 2018-07-30 DIAGNOSIS — R55 Syncope and collapse: Secondary | ICD-10-CM | POA: Diagnosis not present

## 2018-07-30 DIAGNOSIS — Z79899 Other long term (current) drug therapy: Secondary | ICD-10-CM | POA: Diagnosis not present

## 2018-07-30 DIAGNOSIS — Z03818 Encounter for observation for suspected exposure to other biological agents ruled out: Secondary | ICD-10-CM | POA: Diagnosis not present

## 2018-07-30 DIAGNOSIS — I959 Hypotension, unspecified: Secondary | ICD-10-CM | POA: Diagnosis not present

## 2018-07-30 DIAGNOSIS — R42 Dizziness and giddiness: Secondary | ICD-10-CM | POA: Diagnosis not present

## 2018-07-30 DIAGNOSIS — R29810 Facial weakness: Secondary | ICD-10-CM | POA: Diagnosis not present

## 2018-07-30 LAB — URINALYSIS, ROUTINE W REFLEX MICROSCOPIC
Bilirubin Urine: NEGATIVE
Glucose, UA: NEGATIVE mg/dL
Hgb urine dipstick: NEGATIVE
Ketones, ur: 5 mg/dL — AB
Leukocytes,Ua: NEGATIVE
Nitrite: NEGATIVE
Protein, ur: 30 mg/dL — AB
Specific Gravity, Urine: 1.02 (ref 1.005–1.030)
pH: 6 (ref 5.0–8.0)

## 2018-07-30 LAB — COMPREHENSIVE METABOLIC PANEL
ALT: 13 U/L (ref 0–44)
AST: 20 U/L (ref 15–41)
Albumin: 4.1 g/dL (ref 3.5–5.0)
Alkaline Phosphatase: 51 U/L (ref 38–126)
Anion gap: 10 (ref 5–15)
BUN: 9 mg/dL (ref 8–23)
CO2: 24 mmol/L (ref 22–32)
Calcium: 9.4 mg/dL (ref 8.9–10.3)
Chloride: 97 mmol/L — ABNORMAL LOW (ref 98–111)
Creatinine, Ser: 0.86 mg/dL (ref 0.44–1.00)
GFR calc Af Amer: >60 mL/min (ref >60)
GFR calc non Af Amer: >60 mL/min (ref >60)
Glucose, Bld: 149 mg/dL — ABNORMAL HIGH (ref 70–99)
Potassium: 4.1 mmol/L (ref 3.5–5.1)
Sodium: 131 mmol/L — ABNORMAL LOW (ref 135–145)
Total Bilirubin: 0.6 mg/dL (ref 0.3–1.2)
Total Protein: 6.5 g/dL (ref 6.5–8.1)

## 2018-07-30 LAB — CBC WITH DIFFERENTIAL/PLATELET
Abs Immature Granulocytes: 0.01 10*3/uL (ref 0.00–0.07)
Basophils Absolute: 0.1 10*3/uL (ref 0.0–0.1)
Basophils Relative: 1 %
Eosinophils Absolute: 0.1 10*3/uL (ref 0.0–0.5)
Eosinophils Relative: 2 %
HCT: 38.1 % (ref 36.0–46.0)
Hemoglobin: 12.7 g/dL (ref 12.0–15.0)
Immature Granulocytes: 0 %
Lymphocytes Relative: 36 %
Lymphs Abs: 1.6 10*3/uL (ref 0.7–4.0)
MCH: 30.8 pg (ref 26.0–34.0)
MCHC: 33.3 g/dL (ref 30.0–36.0)
MCV: 92.5 fL (ref 80.0–100.0)
Monocytes Absolute: 0.3 10*3/uL (ref 0.1–1.0)
Monocytes Relative: 7 %
Neutro Abs: 2.3 10*3/uL (ref 1.7–7.7)
Neutrophils Relative %: 54 %
Platelets: 264 10*3/uL (ref 150–400)
RBC: 4.12 MIL/uL (ref 3.87–5.11)
RDW: 13.1 % (ref 11.5–15.5)
WBC: 4.3 10*3/uL (ref 4.0–10.5)
nRBC: 0 % (ref 0.0–0.2)

## 2018-07-30 LAB — RAPID URINE DRUG SCREEN, HOSP PERFORMED
Amphetamines: NOT DETECTED
Barbiturates: NOT DETECTED
Benzodiazepines: NOT DETECTED
Cocaine: NOT DETECTED
Opiates: NOT DETECTED
Tetrahydrocannabinol: NOT DETECTED

## 2018-07-30 LAB — ETHANOL: Alcohol, Ethyl (B): <10 mg/dL (ref <10)

## 2018-07-30 LAB — TROPONIN I (HIGH SENSITIVITY)
Troponin I (High Sensitivity): 2 ng/L (ref <18)
Troponin I (High Sensitivity): 2 ng/L (ref <18)

## 2018-07-30 LAB — CBG MONITORING, ED: Glucose-Capillary: 103 mg/dL — ABNORMAL HIGH (ref 70–99)

## 2018-07-30 LAB — MAGNESIUM: Magnesium: 1.8 mg/dL (ref 1.7–2.4)

## 2018-07-30 LAB — SARS CORONAVIRUS 2 BY RT PCR (HOSPITAL ORDER, PERFORMED IN ~~LOC~~ HOSPITAL LAB): SARS Coronavirus 2: NEGATIVE

## 2018-07-30 MED ORDER — SODIUM CHLORIDE 0.9 % IV BOLUS
1000.0000 mL | Freq: Once | INTRAVENOUS | Status: AC
  Administered 2018-07-30: 1000 mL via INTRAVENOUS

## 2018-07-30 MED ORDER — ONDANSETRON HCL 4 MG/2ML IJ SOLN
4.0000 mg | Freq: Once | INTRAMUSCULAR | Status: AC
  Administered 2018-07-30: 4 mg via INTRAVENOUS
  Filled 2018-07-30: qty 2

## 2018-07-30 MED ORDER — SODIUM CHLORIDE 0.9% FLUSH
3.0000 mL | Freq: Once | INTRAVENOUS | Status: AC
  Administered 2018-07-30: 3 mL via INTRAVENOUS

## 2018-07-30 NOTE — ED Notes (Signed)
Patient verbalizes understanding of discharge instructions. Opportunity for questioning and answers were provided. pt discharged from ED. Ambulatory by self  

## 2018-07-30 NOTE — Telephone Encounter (Signed)
Called and spoke with Ruth Wilson, he says that his wife called and told him that she is being discharged and they were unable to tell her the cause for what happened. They advised her to follow-up with her cardiologist.   Will review discharge summary once complete.

## 2018-07-30 NOTE — Telephone Encounter (Signed)
Copied from [place] [id number]. Topic: General - Other >> Jul 30, 2018  9:24 AM Leward Quan A wrote: Reason for CRM: Patient husband called to say that she had fainted on her morning walk so she was taken to the hospital by EMS. Any questions please call husband at Ph# [phone]

## 2018-07-30 NOTE — ED Provider Notes (Signed)
[facility] EMERGENCY DEPARTMENT Provider Note   CSN: [id number] Arrival date & time: 07/30/18  5597    History   Chief Complaint Chief Complaint  Patient presents with  . Loss of Consciousness    HPI Ruth Wilson is a 78 y.o. female.     HPI   Ruth Wilson is a 78 y.o. female, with a history of osteopenia, presenting to the ED following syncopal event that occurred shortly prior to arrival. Patient states she was out walking with her husband, which is something she does frequently.  She began to feel "woozy," lightheaded, and diaphoretic.   She then lost consciousness.  Her husband kept a hold of her and lowered her to the ground.  Patient reportedly unconscious for approximately 1 minute. Upon waking, patient seemed oriented.  There was no noted seizure activity.  She states when the syncopal episode occurred, she had not had any recent change in position or exertion.  Patient states she feels a little nauseous and generally weak currently, but denies other complaints.  She notes she has had episodes of lightheadedness/dizziness that seem to occur without noted pattern beginning about a year ago, however, she has not had any syncopal episodes before today.  She has been evaluated by her PCP, Dr.Hunter, as well as Drs. Baird Cancer with cardiology.  She does not take any medications regularly.  She takes some over-the-counter vitamins, but has had no change in her regimen.  She has been eating and drinking normally.  Denies fever/chills, chest pain, shortness of breath, persistent dizziness, cough, recent illness, abdominal pain, urinary symptoms, neurologic deficits, or any other complaints.   Past Medical History:  Diagnosis Date  . Allergy    cat dander and seasonal  . Cataract   . Diverticulosis of colon without hemorrhage 04/30/2012  . Osteopenia     Patient Active Problem List   Diagnosis Date Noted  . Dizzy spells 06/20/2017   . Advanced directives, counseling/discussion 10/30/2015  . Hip pain, bilateral 04/30/2015  . Hemorrhoids 09/30/2013  . Allergy to cats 09/30/2013  . Hyperlipidemia, unspecified 09/30/2013  . Family hx of colon cancer 04/30/2012  . GERD 02/09/2010  . INSOMNIA-SLEEP DISORDER-UNSPEC 02/09/2010  . Osteopenia 10/17/2006    Past Surgical History:  Procedure Laterality Date  . cataract surgery     tattoo on eye due to damaged iris  . COLONOSCOPY    . OOPHORECTOMY     bilateral  . POLYPECTOMY    . TONSILLECTOMY    . TOTAL ABDOMINAL HYSTERECTOMY       OB History   No obstetric history on file.      Home Medications    Prior to Admission medications   Medication Sig Start Date End Date Taking? Authorizing Provider  Albuterol Sulfate (PROAIR RESPICLICK) 416 (90 Base) MCG/ACT AEPB Inhale 2 puffs into the lungs every 6 (six) hours as needed (wheezing or shortness of breath with cat exposure). 06/20/17  Yes Marin Olp, MD  Calcium Carbonate-Vit D-Min (CALCIUM 1200 PO) Take 1,200 Units by mouth 2 (two) times daily.    Yes [provider]  Cholecalciferol (VITAMIN D-3 PO) Take 2,000 Units by mouth daily.   Yes [provider]  Coenzyme Q10 (CO Q 10 PO) Take 1 capsule by mouth.    Yes [provider]  Cyanocobalamin (VITAMIN B-12) 1000 MCG SUBL Place 1,000 mcg under the tongue.   Yes [provider]  hydroxypropyl methylcellulose / hypromellose (ISOPTO TEARS /  GONIOVISC) 2.5 % ophthalmic solution Place 1 drop into both eyes as needed for dry eyes.   Yes [provider]  Ibuprofen-Diphenhydramine Cit (ADVIL PM PO) Take by mouth.   Yes [provider]  TURMERIC PO Take 1 Dose by mouth daily.   Yes [provider]    Family History Family History  Problem Relation Age of Onset  . Heart disease Mother        MI age 22 but lived to 30  . Colon cancer Father        died 55 from colon cancer    Social History Social  History   Tobacco Use  . Smoking status: Never Smoker  . Smokeless tobacco: Never Used  Substance Use Topics  . Alcohol use: Yes    Alcohol/week: 14.0 standard drinks    Types: 14 Standard drinks or equivalent per week    Comment: advised to cut to 1 a day-red wine  . Drug use: No     Allergies   Alendronate sodium, Iodides, and Risedronate sodium   Review of Systems Review of Systems  Constitutional: Negative for chills and fever.  Respiratory: Negative for cough and shortness of breath.   Cardiovascular: Negative for chest pain and leg swelling.  Gastrointestinal: Positive for nausea. Negative for abdominal pain and vomiting.  Genitourinary: Negative for dysuria and frequency.  Musculoskeletal: Negative for back pain and neck pain.  Neurological: Positive for dizziness (Resolved prior to arrival) and syncope. Negative for seizures, weakness and numbness.  All other systems reviewed and are negative.    Physical Exam Updated Vital Signs BP (!) 127/58   Pulse 78   Temp (!) 97.5 F (36.4 C) (Oral)   Resp 16   Ht 5\' 4"  (1.626 m)   Wt 65 kg   LMP  (LMP Unknown)   SpO2 98%   BMI 24.60 kg/m   Physical Exam Vitals signs and nursing note reviewed.  Constitutional:      General: She is not in acute distress.    Appearance: She is well-developed. She is not diaphoretic.  HENT:     Head: Normocephalic and atraumatic.     Mouth/Throat:     Mouth: Mucous membranes are moist.     Pharynx: Oropharynx is clear.     Comments: No noted dental or intraoral trauma. Eyes:     Extraocular Movements: Extraocular movements intact.     Conjunctiva/sclera: Conjunctivae normal.     Pupils: Pupils are equal, round, and reactive to light.  Neck:     Musculoskeletal: Normal range of motion and neck supple.  Cardiovascular:     Rate and Rhythm: Normal rate and regular rhythm.     Pulses: Normal pulses.          Radial pulses are 2+ on the right side and 2+ on the left side.        Posterior tibial pulses are 2+ on the right side and 2+ on the left side.     Heart sounds: Normal heart sounds.     Comments: Tactile temperature in the extremities appropriate and equal bilaterally. Pulmonary:     Effort: Pulmonary effort is normal. No respiratory distress.     Breath sounds: Normal breath sounds.  Abdominal:     Palpations: Abdomen is soft.     Tenderness: There is no abdominal tenderness. There is no guarding.  Musculoskeletal:     Right lower leg: No edema.     Left lower leg: No edema.  Comments: Normal motor function intact in all extremities. No midline spinal tenderness.   Lymphadenopathy:     Cervical: No cervical adenopathy.  Skin:    General: Skin is warm and dry.  Neurological:     Mental Status: She is alert and oriented to person, place, and time.     Comments: Sensation grossly intact to light touch in the extremities. No noted speech deficits. No aphasia. Patient handles oral secretions without difficulty. No noted swallowing defects.  Equal grip strength bilaterally. No arm drift. Strength 5/5 in the upper extremities. Strength 5/5 in the lower extremities.  Negative Romberg. No gait disturbance.  Coordination intact including heel to shin and finger to nose.  Cranial nerves III-XII grossly intact.  No noted visual field deficit. No facial droop.   Psychiatric:        Mood and Affect: Mood and affect normal.        Speech: Speech normal.        Behavior: Behavior normal.      ED Treatments / Results  Labs (all labs ordered are listed, but only abnormal results are displayed) Labs Reviewed  URINALYSIS, ROUTINE W REFLEX MICROSCOPIC - Abnormal; Notable for the following components:      Result Value   APPearance HAZY (*)    Ketones, ur 5 (*)    Protein, ur 30 (*)    Bacteria, UA RARE (*)    All other components within normal limits  COMPREHENSIVE METABOLIC PANEL - Abnormal; Notable for the following components:   Sodium 131 (*)     Chloride 97 (*)    Glucose, Bld 149 (*)    All other components within normal limits  CBG MONITORING, ED - Abnormal; Notable for the following components:   Glucose-Capillary 103 (*)    All other components within normal limits  SARS CORONAVIRUS 2 (HOSPITAL ORDER, [place] [facility])  URINE CULTURE  CBC WITH DIFFERENTIAL/PLATELET  ETHANOL  RAPID URINE DRUG SCREEN, HOSP PERFORMED  MAGNESIUM  TROPONIN I (HIGH SENSITIVITY)  TROPONIN I (HIGH SENSITIVITY)    EKG EKG Interpretation  Date/Time:  Monday July 30 2018 10:20:11 EDT Ventricular Rate:  71 PR Interval:    QRS Duration: 89 QT Interval:  399 QTC Calculation: 434 R Axis:   57 Text Interpretation:  Sinus rhythm Normal ECG No previous tracing Confirmed by Blanchie Dessert [phone]) on 07/30/2018 4:51:29 PM   Radiology Dg Chest 2 View  Result Date: 07/30/2018 CLINICAL DATA:  Syncope. EXAM: CHEST - 2 VIEW COMPARISON:  None. FINDINGS: The heart, hila, mediastinum, lungs, and pleura are unremarkable. No acute abnormalities. IMPRESSION: No active cardiopulmonary disease. Electronically Signed   By: Dorise Bullion III M.D   On: 07/30/2018 10:12    Procedures Procedures (including critical care time)  Medications Ordered in ED Medications  sodium chloride flush (NS) 0.9 % injection 3 mL (3 mLs Intravenous Given 07/30/18 1118)  sodium chloride 0.9 % bolus 1,000 mL (0 mLs Intravenous Stopped 07/30/18 1215)  ondansetron (ZOFRAN) injection 4 mg (4 mg Intravenous Given 07/30/18 1117)     Initial Impression / Assessment and Plan / ED Course  I have reviewed the triage vital signs and the nursing notes.  Pertinent labs & imaging results that were available during my care of the patient were reviewed by me and considered in my medical decision making (see chart for details).        Patient presents for evaluation following episode of syncope. Patient is nontoxic appearing, afebrile,  not tachycardic, not  tachypneic, not hypotensive, maintains excellent SPO2 on room air, and is in no apparent distress.  No focal neuro deficits on exam.   Ketonuria, decreased sodium, and decreased chloride, may point to mild dehydration.  Delta troponin is negative.  No acute abnormalities on chest x-ray.  Patient's work-up is overall reassuring. Low risk using [facility] syncope rule. She will follow-up with cardiology. The patient was given instructions for home care as well as return precautions. Patient voices understanding of these instructions, accepts the plan, and is comfortable with discharge.  Findings and plan of care discussed with Julianne Rice, MD.   Vitals:   07/30/18 1245 07/30/18 1300 07/30/18 1315 07/30/18 1330  BP: 133/65 131/60 123/71 (!) 161/60  Pulse: 79 78 78 89  Resp: 19 18 20  (!) 25  Temp:      TempSrc:      SpO2: 100% 99% 99% 100%  Weight:      Height:         Final Clinical Impressions(s) / ED Diagnoses   Final diagnoses:  Syncope and collapse    ED Discharge Orders    None       Layla Maw 07/30/18 1651    Julianne Rice, MD 07/31/18 0800

## 2018-07-30 NOTE — Telephone Encounter (Signed)
FYI patient in hospital

## 2018-07-30 NOTE — ED Triage Notes (Signed)
Arrived via EMS patient walking with her husband developed dizziness, nausea, and diaphoretic. Patient lowered to the ground by husband and patient had a syncope event lasting approximately 1 minute.  Patient alert answering and following commands appropriate. CBG 126 by EMS. Patient has similar symptoms in the past however first time she past out. Has been evaluated by cardiologist Dr Stanford Breed and Dr Jens Som.

## 2018-07-30 NOTE — Telephone Encounter (Signed)
° °  Husband of patient called to make Dr. Stanford Breed aware that the patient is currently in the ED. She collapsed on a walk in her neighborhood. These are the same issues that Dr. Stanford Breed has seen her for in the past.   Please call the husband if you have any questions

## 2018-07-30 NOTE — Telephone Encounter (Signed)
Pt currently at the ED. Forwarding to Dr. Yong Channel as Ruth Wilson.   Forward message back and I will contact husband later today or tomorrow to check on them.

## 2018-07-30 NOTE — Telephone Encounter (Signed)
Follow up    Patient is calling because she was just released from the hospital following an episode of syncope. She was advised to follow up with cardiologist. I offered an appt with Kerin Ransom on 7/28. Patient declined and stated that she wanted to speak with the nurse to see what the nurse may suggest. Please call.

## 2018-07-30 NOTE — ED Notes (Signed)
Pt ambulated to the RR no dizzy not light headedness. when doing orthostatic pt did not complain of any dizzy and also denied light headedness.

## 2018-07-30 NOTE — Telephone Encounter (Signed)
    COVID-19 Pre-Screening Questions:  . In the past 7 to 10 days have you had a cough,  shortness of breath, headache, congestion, fever (100 or greater) body aches, chills, sore throat, or sudden loss of taste or sense of smell? NO . Have you been around anyone with known Covid 19. NO  . Have you been around anyone who is awaiting Covid 19 test results in the past 7 to 10 days? NO . Have you been around anyone who has been exposed to Covid 19, or has mentioned symptoms of Covid 19 within the past 7 to 10 days? NO PT WILL ARRIVE EARLY IN MASK AND NO VISITORS

## 2018-07-30 NOTE — Telephone Encounter (Signed)
Please thank husband for the update- I will follow along with the notes in the hospital

## 2018-07-30 NOTE — Discharge Instructions (Signed)
There was some indication of minor dehydration.  Lab results, EKG, and chest x-ray were all otherwise reassuring. Follow-up with cardiology on this matter.  Call the office to set up an appointment.

## 2018-07-31 ENCOUNTER — Other Ambulatory Visit: Payer: Self-pay

## 2018-07-31 ENCOUNTER — Encounter: Payer: Self-pay | Admitting: Physician Assistant

## 2018-07-31 ENCOUNTER — Ambulatory Visit: Payer: PPO | Admitting: General Practice

## 2018-07-31 VITALS — BP 141/79 | HR 77 | Ht 64.0 in | Wt 140.2 lb

## 2018-07-31 DIAGNOSIS — I472 Ventricular tachycardia: Secondary | ICD-10-CM | POA: Diagnosis not present

## 2018-07-31 DIAGNOSIS — R55 Syncope and collapse: Secondary | ICD-10-CM | POA: Diagnosis not present

## 2018-07-31 DIAGNOSIS — R42 Dizziness and giddiness: Secondary | ICD-10-CM | POA: Diagnosis not present

## 2018-07-31 DIAGNOSIS — I4729 Other ventricular tachycardia: Secondary | ICD-10-CM

## 2018-07-31 LAB — URINE CULTURE

## 2018-07-31 MED ORDER — METOPROLOL TARTRATE 25 MG PO TABS: 25.0000 mg | ORAL_TABLET | Freq: Two times a day (BID) | ORAL | 3 refills | Status: DC

## 2018-07-31 NOTE — Telephone Encounter (Signed)
ED note complete. Pt has appointment with cardiology today.

## 2018-07-31 NOTE — Patient Instructions (Signed)
Medication Instructions:   START Metoprolol Tartrate 25 mg 2 times a day  If you need a refill on your cardiac medications before your next appointment, please call your pharmacy.   Lab work:  NONE ordered at this time of appointment   If you have labs (blood work) drawn today and your tests are completely normal, you will receive your results only by: Marland Kitchen MyChart Message (if you have MyChart) OR . A paper copy in the mail If you have any lab test that is abnormal or we need to change your treatment, we will call you to review the results.  Testing/Procedures: NONE ordered at this time of appointment   Follow-Up: At [facility], you and your health needs are our priority.  As part of our continuing mission to provide you with exceptional heart care, we have created designated Provider Care Teams.  These Care Teams include your primary Cardiologist (physician) and Advanced Practice Providers (APPs -  Physician Assistants and Nurse Practitioners) who all work together to provide you with the care you need, when you need it. You will need a First available appointment with Virl Axe, MD or one of the following Advanced Practice Providers on your designated Care Team:   Chanetta Marshall, NP . Tommye Standard, PA-C   Any Other Special Instructions Will Be Listed Below (If Applicable).

## 2018-07-31 NOTE — Progress Notes (Signed)
Cardiology Clinic Note   Patient Name: Ruth Wilson Date of Encounter: 07/31/2018  Primary Care Provider:  Marin Olp, MD Primary Cardiologist:  Kirk Ruths, MD  Patient Profile    Ruth Wilson 78 year old female presents today for follow-up of syncopal event while she was walking with her husband. She presented to the ED for evaluation (07/30/2018) and was referred to cardiology.  Past Medical History    Past Medical History:  Diagnosis Date   Allergy    cat dander and seasonal   Cataract    Diverticulosis of colon without hemorrhage 04/30/2012   Osteopenia    Past Surgical History:  Procedure Laterality Date   cataract surgery     tattoo on eye due to damaged iris   COLONOSCOPY     OOPHORECTOMY     bilateral   POLYPECTOMY     TONSILLECTOMY     TOTAL ABDOMINAL HYSTERECTOMY      Allergies  Allergies  Allergen Reactions   Alendronate Sodium     REACTION: GI problems fosamax   Iodides Hives   Risedronate Sodium     REACTION: GI problems boniva    History of Present Illness    Ruth Wilson presented to the emergency department following a syncopal event (07/28/2018).  She had been walking with her husband began to feel woozy, lightheaded and diaphoretic.  She then lost consciousness and her husband lowered her to the ground.  She was in this unconscious state for about 1 minute.  No seizure activity was noted and she was alert and oriented as she regained consciousness.  She noted that she has been having dizziness symptoms for about 1 year but never had syncopal events.  She was last seen by Dr. Stanford Breed 11/09/2017.  In August 2019 she was evaluated with a Holter monitor that showed sinus bradycardia to sinus tachycardia with frequent PVCs.  It also showed occasional PACs, PVCs a 7 beat run of nonsustained ventricular tachycardia.  She did not associate the episodes with chest pain, palpitations or dyspnea and noted that she  had  8-9 episodes from February through May 2019.  Ultimately during this evaluation the etiology was unclear.  Due to her sustained queasiness lasting for 1- 2 days after each episode it was believed the episodes may be vagal in nature. If symptoms persisted alive core monitor was recommended.  Her other PMH includes GERD, osteopenia, insomnia, hyperlipidemia, bilateral and hip pain.  She presents to the clinic today and states she feels well.  She states that she has only had 2 episodes of dizziness/presyncope this year was scheduled with Dr. Caryl Comes to have a loop recorder installed which was canceled due to [place].  On HAFB90 she had a syncopal event while walking with her husband.  This event lasted less than 1 minute.  She did not notice any chest pain, palpitations, or dyspnea prior to the event.  No seizure activity was noted.  And when she regained consciousness she was alert and oriented.  Her labs were all normal including magnesium and phosphorus.  She states she does not notice any symptoms prior to these events.  She states she had her normal fatigue following the event that lasted for 1 day and she has now resumed all of her normal daily activities including walking 2-1/4 miles a day.   She denies chest pain, shortness of breath, lower extremity edema, fatigue, weakness, hematuria, hemoptysis, orthopnea and PND.   Home Medications    Prior to  Admission medications   Medication Sig Start Date End Date Taking? Authorizing Provider  Albuterol Sulfate (PROAIR RESPICLICK) 979 (90 Base) MCG/ACT AEPB Inhale 2 puffs into the lungs every 6 (six) hours as needed (wheezing or shortness of breath with cat exposure). 06/20/17   Marin Olp, MD  Calcium Carbonate-Vit D-Min (CALCIUM 1200 PO) Take 1,200 Units by mouth 2 (two) times daily.     [provider]  Cholecalciferol (VITAMIN D-3 PO) Take 2,000 Units by mouth daily.    [provider]  Coenzyme Q10 (CO Q 10 PO) Take 1 capsule by  mouth.     [provider]  Cyanocobalamin (VITAMIN B-12) 1000 MCG SUBL Place 1,000 mcg under the tongue.    [provider]  hydroxypropyl methylcellulose / hypromellose (ISOPTO TEARS / GONIOVISC) 2.5 % ophthalmic solution Place 1 drop into both eyes as needed for dry eyes.    [provider]  Ibuprofen-Diphenhydramine Cit (ADVIL PM PO) Take by mouth.    [provider]  TURMERIC PO Take 1 Dose by mouth daily.    [provider]    Family History    Family History  Problem Relation Age of Onset   Heart disease Mother        MI age 22 but lived to 13   Colon cancer Father        died 7 from colon cancer   She indicated that her mother is deceased. She indicated that her father is deceased. She indicated that her maternal grandmother is deceased. She indicated that her maternal grandfather is deceased. She indicated that her paternal grandmother is deceased. She indicated that her paternal grandfather is deceased.  Social History    Social History   Socioeconomic History   Marital status: Married    Spouse name: Not on file   Number of children: 3   Years of education: Not on file   Highest education level: Not on file  Occupational History   Occupation: Retired    Fish farm manager: RETIRED  Scientist, product/process development strain: Not on file   Food insecurity    Worry: Not on file    Inability: Not on file   Transportation needs    Medical: Not on file    Non-medical: Not on file  Tobacco Use   Smoking status: Never Smoker   Smokeless tobacco: Never Used  Substance and Sexual Activity   Alcohol use: Yes    Alcohol/week: 14.0 standard drinks    Types: 14 Standard drinks or equivalent per week    Comment: advised to cut to 1 a day-red wine   Drug use: No   Sexual activity: Not on file  Lifestyle   Physical activity    Days per week: Not on file    Minutes per session: Not on file   Stress: Not on file    Relationships   Social connections    Talks on phone: Not on file    Gets together: Not on file    Attends religious service: Not on file    Active member of club or organization: Not on file    Attends meetings of clubs or organizations: Not on file    Relationship status: Not on file   Intimate partner violence    Fear of current or ex partner: Not on file    Emotionally abused: Not on file    Physically abused: Not on file    Forced sexual activity: Not on file  Other Topics Concern   Not on file  Social History Narrative   Married 51 years in 2015. 3 children. Stacy lives in [place] middle divorced with 3 boys, Clair Gulling oldest living in [place] has 6 children and is a Restaurant manager, fast food, Gershon Mussel lives in [place] no children but 4 dogs.       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  Cardiovascular:  No chest pain, dyspnea on exertion, edema, orthopnea, palpitations, paroxysmal nocturnal dyspnea. Dermatological: No rash, lesions/masses Respiratory: No cough, dyspnea Urologic: No hematuria, dysuria Abdominal:   No nausea, vomiting, diarrhea, bright red blood per rectum, melena, or hematemesis Neurologic:  No visual changes, wkns, changes in mental status. All other systems reviewed and are otherwise negative except as noted above.  Physical Exam    VS:  BP (!) 141/79    Pulse 77    Ht 5\' 4"  (1.626 m)    Wt 140 lb 3.2 oz (63.6 kg)    LMP  (LMP Unknown)    SpO2 98%    BMI 24.07 kg/m  , BMI Body mass index is 24.07 kg/m. GEN: Well nourished, well developed, in no acute distress. HEENT: normal. Neck: Supple, no JVD, carotid bruits, or masses. Cardiac: RRR, no murmurs, rubs, or gallops. No clubbing, cyanosis, edema.  Radials/DP/PT 2+ and equal bilaterally.  Respiratory:  Respirations regular and unlabored, clear to auscultation bilaterally. GI: Soft,  nontender, nondistended, BS + x 4. MS: no deformity or atrophy. Skin: warm and dry, no rash. Neuro:  Strength and sensation are intact. Psych: Normal affect.  Accessory Clinical Findings    ECG personally reviewed by me today-sinus rhythm 77 bpm- No acute changes  EKG 07/30/2018 sinus rhythm 71 BPM.  EKG 03/22/2018 sinus rhythm 71 bpm.  Card[employer]Net event monitor 08/24/2017: Sinus bradycardia to sinus tach with frequent PVCs Sinus with occasional PACs, PVCs and 6-7 beat run of nonsustained ventricular tachycardia.  (Patient asymptomatic during event)   Assessment & Plan   1.  Syncope and collapse- 1 episode of dizziness January 2020, one episode 07/28/2018 syncope and collapse while walking with her husband. -Ordered metoprolol tartrate 25 mg twice daily -Recommended patient not drive -Reschedule with electrophysiology -Reviewed CMP, magnesium, HS troponin.  2.  Dizziness- 2 episodes of January 2019, one episode March 2019, one episode October 2019, one episode January 2020, July 18,2020 syncopal event. -Increase p.o. fluid intake -Recommend patient does not drive -Reschedule with electrophysiology -Ordered metoprolol tartrate 25 mg twice daily  3.  Nonsustained ventricular tachycardia- 03/22/2018 Dr. Caryl Comes suggested implantable loop recorder to ascertain arrhythmic association.  He felt with normal ECG/echo that life-threatening arrhythmias were unlikely and PVC morphology suggested outflow tract source.  Patient was agreeable and scheduled for implant.  Procedure was canceled due to COVID-19. -Reschedule with electrophysiology  Disposition: First available appointment with electrophysiology.  No labs today, 07/30/2018 lab work emergency department.  Deberah Pelton, NP 07/31/2018, 9:05 AM

## 2018-08-01 NOTE — Telephone Encounter (Signed)
Pt saw cardiology yesterday. EKG was done. They added Metoprolol 25 mg BID. Pt has appointment scheduled with Dr. Caryl Comes to discuss loop.   Does pt need to schedule follow-up witih you? If so, in what time frame would you like to see her?  Thanks!

## 2018-08-01 NOTE — Telephone Encounter (Signed)
Called pt and advised.  

## 2018-08-01 NOTE — Telephone Encounter (Signed)
I am okay with focusing on cardiology follow-up right now unless she just strongly prefers to review hospitalization with me but I will also be out of town next week so have limited availability

## 2018-08-02 NOTE — Telephone Encounter (Signed)
Left message for pt to call.

## 2018-08-03 NOTE — Telephone Encounter (Signed)
Spoke with pt, Aware of dr crenshaw's recommendations.  °

## 2018-08-03 NOTE — Telephone Encounter (Signed)
Spoke with Ruth Wilson, she was started on metoprolol by the NP and she has taken 2 full days, she was told that they did not want her heart rate getting to 50. This morning her heart rate after taking her medication was 52. Her bp has been running 115/72 to 125/85. 52 is the lowest she has seen her pulse. Due to the weekend coming up she wanted to let us know her heart rate so if she needed to adjust the metoprolol. She is currently taking 25 mg bid. She denies dizziness or other problems. Will forward for dr Stanford Breed review

## 2018-08-03 NOTE — Telephone Encounter (Signed)
Continue metoprolol at present dose and follow HR Ruth Wilson

## 2018-08-06 ENCOUNTER — Telehealth (INDEPENDENT_AMBULATORY_CARE_PROVIDER_SITE_OTHER): Payer: PPO | Admitting: Internal Medicine

## 2018-08-06 ENCOUNTER — Other Ambulatory Visit: Payer: Self-pay

## 2018-08-06 ENCOUNTER — Encounter: Payer: Self-pay | Admitting: Internal Medicine

## 2018-08-06 VITALS — BP 128/76 | HR 57 | Wt 140.0 lb

## 2018-08-06 DIAGNOSIS — R42 Dizziness and giddiness: Secondary | ICD-10-CM

## 2018-08-06 DIAGNOSIS — I472 Ventricular tachycardia: Secondary | ICD-10-CM

## 2018-08-06 DIAGNOSIS — I4729 Other ventricular tachycardia: Secondary | ICD-10-CM

## 2018-08-06 DIAGNOSIS — R55 Syncope and collapse: Secondary | ICD-10-CM

## 2018-08-06 NOTE — Patient Instructions (Signed)
Medication Instructions:  Your physician recommends that you continue on your current medications as directed. Please refer to the Current Medication list given to you today.   Labwork: None ordered  Testing/Procedures: None ordered  Follow-Up:   Please call after your trip to [place] so [place]/ Dr. Olin Pia nurse can set you up for your Loop Recorder Implant.      Any Other Special Instructions Will Be Listed Below (If Applicable).   Dr. Caryl Comes recommends NO DRIVING for at least 6 weeks.       If you need a refill on your cardiac medications before your next appointment, please call your pharmacy.

## 2018-08-06 NOTE — Progress Notes (Signed)
Electrophysiology TeleHealth Note   Due to national recommendations of social distancing due to COVID 19, an audio/video telehealth visit is felt to be most appropriate for this patient at this time.  See MyChart message from today for the patient's consent to telehealth for [facility].   Date:  08/06/2018   ID:  Ruth Wilson, DOB [birth year], MRN [id number]  Location: patient's home  Provider location: [address] [place]  Evaluation Performed: Follow-up visit  PCP:  Ruth Olp, MD  Cardiologist:  [facility] Electrophysiologist:  Ruth Wilson   Chief Complaint: syncope  History of Present Illness:    Ruth Wilson is a 78 y.o. female who presents via audio/video conferencing for a telehealth visit today.  Since last being seen in our clinic for presumed neurally mediated episodes with ? Arrhythmic trigger the patient reports syncope   The patient's syncopal event occurred 48 hours after a presyncopal event.  The former had left her washed out and drained for about 36 hours.  His syncope occurred while she was walking with her husband.  She stopped walking.  She was not responding verbally.  He put his arm around her.  She slumped into his arms and he put her to the ground.  45 seconds later or so she awakened.  There was significant residual orthostatic intolerance and her recovery symptoms were clearly stereotypical reminding her of her presyncopal events.  She was taken to the hospital.  Orthostatic vital signs were unrevealing.  She was discharged.  The patient denies symptoms of fevers, chills, cough, or new SOB worrisome for COVID 19.    Past Medical History:  Diagnosis Date  . Allergy    cat dander and seasonal  . Cataract   . Diverticulosis of colon without hemorrhage 04/30/2012  . Osteopenia     Past Surgical History:  Procedure Laterality Date  . cataract surgery     tattoo on eye due to damaged iris  . COLONOSCOPY    . OOPHORECTOMY     bilateral  . POLYPECTOMY    . TONSILLECTOMY    . TOTAL ABDOMINAL HYSTERECTOMY      Current Outpatient Medications  Medication Sig Dispense Refill  . Albuterol Sulfate (PROAIR RESPICLICK) 465 (90 Base) MCG/ACT AEPB Inhale 2 puffs into the lungs every 6 (six) hours as needed (wheezing or shortness of breath with cat exposure). 1 each 1  . Calcium Carbonate-Vit D-Min (CALCIUM 1200 PO) Take 1,200 Units by mouth 2 (two) times daily.     . Cholecalciferol (VITAMIN D-3 PO) Take 2,000 Units by mouth daily.    . Coenzyme Q10 (CO Q 10 PO) Take 1 capsule by mouth.     . Cyanocobalamin (VITAMIN B-12) 1000 MCG SUBL Place 1,000 mcg under the tongue.    . hydroxypropyl methylcellulose / hypromellose (ISOPTO TEARS / GONIOVISC) 2.5 % ophthalmic solution Place 1 drop into both eyes as needed for dry eyes.    . Ibuprofen-Diphenhydramine Cit (ADVIL PM PO) Take by mouth.    . magnesium oxide (MAG-OX) 400 MG tablet Take 400 mg by mouth daily.    . metoprolol tartrate (LOPRESSOR) 25 MG tablet Take 1 tablet (25 mg total) by mouth 2 (two) times daily. 180 tablet 3  . TURMERIC PO Take 1 Dose by mouth daily.     No current facility-administered medications for this visit.     Allergies:   Alendronate sodium, Iodides, and Risedronate sodium   Social History:  The patient  reports that she has never smoked. She has never used smokeless tobacco. She reports current alcohol use of about 14.0 standard drinks of alcohol per week. She reports that she does not use drugs.   Family History:  The patient's   family history includes Colon cancer in her father; Heart disease in her mother.   ROS:  Please see the history of present illness.   All other systems are personally reviewed and negative.    Exam:    Vital Signs:  BP 128/76 (BP Location: Right Arm, Patient Position: Sitting, Cuff Size: Normal)   Pulse (!) 57   Wt 140 lb (63.5 kg)   LMP  (LMP Unknown)   BMI 24.03 kg/m  *   Well appearing, alert and  conversant, regular work of breathing,  good skin color Eyes- anicteric, neuro- grossly intact, skin- no apparent rash or lesions or cyanosis, mouth- oral mucosa is pink   Labs/Other Tests and Data Reviewed:    Recent Labs: 07/30/2018: ALT 13; BUN 9; Creatinine, Ser 0.86; Hemoglobin 12.7; Magnesium 1.8; Platelets 264; Potassium 4.1; Sodium 131   Wt Readings from Last 3 Encounters:  08/06/18 140 lb (63.5 kg)  07/31/18 140 lb 3.2 oz (63.6 kg)  07/30/18 143 lb 4.8 oz (65 kg)     Other studies personally reviewed: Additional studies/ records that were reviewed today include:As above including the ER records    ASSESSMENT & PLAN:    Spells probably neurally mediated  PACs-moderately frequent-  Atrial tachycardia-nonsustained  Ventricular tachycardia-nonsustained  Elevated blood pressure  Cardiac evaluation has included an echocardiogram 2018 which is normal apart from moderate left atrial enlargement, per ECG 7/20 was normal.  The stereotypical recovery phase strongly supports a neurally mediated mechanism and the question is what is the trigger.  Her event recorder demonstrated both nonsustained ventricular tachycardia as well as atrial ectopy.  At this point, presuming that her heart is normal, our recommendation for a implantable loop recorder remains I think most appropriate.  She is agreeable.  We will proceed when she returns from her vacation in [place].  She is advised not to drive for at least 6 weeks.  We spent more than 50% of our >25 min visit in face to face counseling regarding the above      COVID 19 screen The patient denies symptoms of COVID 19 at this time.  The importance of social distancing was discussed today.  Follow-up: As above     Current medicines are reviewed at length with the patient today.   The patient does not have concerns regarding her medicines.  The following changes were made today:  none  Labs/ tests ordered today include:   No  orders of the defined types were placed in this encounter.   Future tests ( post COVID )     Patient Risk:  after full review of this patients clinical status, I feel that they are at moderate  risk at this time.  Today, I have spent 14* minutes with the patient with telehealth technology discussing the above.  Signed, Virl Axe, MD  08/06/2018 9:00 AM     [facility] [place] [address] [place] [address] [phone] (office) [phone] (fax)

## 2018-08-08 ENCOUNTER — Ambulatory Visit: Payer: PPO | Admitting: Internal Medicine

## 2018-08-10 ENCOUNTER — Other Ambulatory Visit: Payer: Self-pay

## 2018-08-14 ENCOUNTER — Telehealth: Payer: Self-pay

## 2018-08-14 ENCOUNTER — Telehealth: Payer: Self-pay | Admitting: Internal Medicine

## 2018-08-14 NOTE — Telephone Encounter (Signed)
Pt wanted to let Dr. Caryl Comes know that she will be out of town until next Wednesday 8/12 and would like to schedule her device after that. Advised pt that Dr. Caryl Comes and his nurse were out of office this week but will route to them for FYI to schedule upon return.

## 2018-08-14 NOTE — Telephone Encounter (Signed)
New Message     Pt is calling and wondering when she can schedule to get her device    Please call back

## 2018-08-20 NOTE — Telephone Encounter (Signed)
Pt has agreed to in office implant on 8/13 with Dr. Caryl Comes.

## 2018-08-23 ENCOUNTER — Other Ambulatory Visit: Payer: Self-pay

## 2018-08-23 ENCOUNTER — Ambulatory Visit: Payer: PPO | Admitting: Internal Medicine

## 2018-08-23 VITALS — HR 76 | Ht 64.0 in | Wt 140.0 lb

## 2018-08-23 DIAGNOSIS — I4729 Other ventricular tachycardia: Secondary | ICD-10-CM

## 2018-08-23 DIAGNOSIS — R55 Syncope and collapse: Secondary | ICD-10-CM | POA: Diagnosis not present

## 2018-08-23 DIAGNOSIS — I472 Ventricular tachycardia: Secondary | ICD-10-CM

## 2018-08-23 NOTE — Progress Notes (Signed)
Patient Care Team: Marin Olp, MD as PCP - General (Family Medicine) Stanford Breed Denice Bors, MD as PCP - Cardiology (Cardiology) Deboraha Sprang, MD as PCP - Electrophysiology (Cardiology)   HPI  Ruth Wilson is a 78 y.o. female Seen for recurrent syncope with VT and SVT nonsustained   Recovery and prodrome symptoms are most consistent with a neurally mediated reflex  Had a recurrent event in [place] over the last few weeks, with a triggering sensation lasting only seconds--with a typical recovery./.   Records and Results Reviewed   Past Medical History:  Diagnosis Date  . Allergy    cat dander and seasonal  . Cataract   . Diverticulosis of colon without hemorrhage 04/30/2012  . Osteopenia     Past Surgical History:  Procedure Laterality Date  . cataract surgery     tattoo on eye due to damaged iris  . COLONOSCOPY    . OOPHORECTOMY     bilateral  . POLYPECTOMY    . TONSILLECTOMY    . TOTAL ABDOMINAL HYSTERECTOMY      Current Outpatient Medications  Medication Sig Dispense Refill  . Albuterol Sulfate (PROAIR RESPICLICK) 784 (90 Base) MCG/ACT AEPB Inhale 2 puffs into the lungs every 6 (six) hours as needed (wheezing or shortness of breath with cat exposure). 1 each 1  . Calcium Carbonate-Vit D-Min (CALCIUM 1200 PO) Take 1,200 Units by mouth 2 (two) times daily.     . Cholecalciferol (VITAMIN D-3 PO) Take 2,000 Units by mouth daily.    . Coenzyme Q10 (CO Q 10 PO) Take 1 capsule by mouth.     . Cyanocobalamin (VITAMIN B-12) 1000 MCG SUBL Place 1,000 mcg under the tongue.    . hydroxypropyl methylcellulose / hypromellose (ISOPTO TEARS / GONIOVISC) 2.5 % ophthalmic solution Place 1 drop into both eyes as needed for dry eyes.    . Ibuprofen-Diphenhydramine Cit (ADVIL PM PO) Take by mouth.    . magnesium oxide (MAG-OX) 400 MG tablet Take 400 mg by mouth daily.    . metoprolol tartrate (LOPRESSOR) 25 MG tablet Take 1 tablet (25 mg total) by mouth 2 (two) times  daily. 180 tablet 3  . TURMERIC PO Take 1 Dose by mouth daily.     No current facility-administered medications for this visit.     Allergies  Allergen Reactions  . Alendronate Sodium     REACTION: GI problems fosamax  . Iodides Hives  . Risedronate Sodium     REACTION: GI problems boniva      Social History   Tobacco Use  . Smoking status: Never Smoker  . Smokeless tobacco: Never Used  Substance Use Topics  . Alcohol use: Yes    Alcohol/week: 14.0 standard drinks    Types: 14 Standard drinks or equivalent per week    Comment: advised to cut to 1 a day-red wine  . Drug use: No     Family History  Problem Relation Age of Onset  . Heart disease Mother        MI age 16 but lived to 20  . Colon cancer Father        died 11 from colon cancer     No outpatient medications have been marked as taking for the 08/23/18 encounter (Office Visit) with Deboraha Sprang, MD.     Review of Systems negative except from HPI and PMH  Physical Exam Pulse 76   Ht 5\' 4"  (1.626 m)   Wt  140 lb (63.5 kg)   LMP  (LMP Unknown)   BMI 24.03 kg/m  Well developed and well nourished in no acute distress HENT normal E scleral and icterus clear Neck Supple Clear to ausculation  *Regular rate and rhythm  Soft  No clubbing cyanosis  Edema Alert and oriented, grossly normal motor and sensory function Skin Warm and Dry    Assessment and  Plan  Spells probably neurally mediated  PACs-moderately frequent-  Atrial tachycardia-nonsustained  Ventricular tachycardia-nonsustained  Elevated blood pressure  Recurrent syncope prob neurally mediated with known VT and SVT nonsustained--possibly serving as the trigger>>Implantable loop recorder  Pre op Dx SYNCOPE Post op Dx Same  Procedure  Loop Recorder implantation  After routine prep and drape of the left parasternal area, a small incision was created. A [employer] LINQ Reveal Loop Recorder  Serial Number  [id number] was inserted.     SteriStrip dressing was  applied.  The patient tolerated the procedure without apparent complication.  EBL < 10cc

## 2018-09-10 ENCOUNTER — Telehealth: Payer: Self-pay | Admitting: Family Medicine

## 2018-09-10 NOTE — Telephone Encounter (Signed)
I left a message asking the patient to call me at [phone] to schedule AWV with Courtney. VDM (Dee-Dee) °

## 2018-09-13 ENCOUNTER — Other Ambulatory Visit: Payer: Self-pay

## 2018-09-13 ENCOUNTER — Telehealth: Payer: PPO | Admitting: *Deleted

## 2018-09-13 LAB — CUP PACEART INCLINIC DEVICE CHECK
Date Time Interrogation Session: 20200903140129
Implantable Pulse Generator Implant Date: 20200813

## 2018-09-13 NOTE — Progress Notes (Signed)
Virtual wound check with patient permission. Wound well healed with no redness or drainage. Patient reports no s/sx of infection.

## 2018-09-25 ENCOUNTER — Ambulatory Visit (INDEPENDENT_AMBULATORY_CARE_PROVIDER_SITE_OTHER): Payer: PPO | Admitting: *Deleted

## 2018-09-25 DIAGNOSIS — R55 Syncope and collapse: Secondary | ICD-10-CM

## 2018-09-25 LAB — CUP PACEART REMOTE DEVICE CHECK
Date Time Interrogation Session: 20200915183817
Implantable Pulse Generator Implant Date: 20200813

## 2018-10-01 ENCOUNTER — Telehealth: Payer: Self-pay | Admitting: Physical Therapy

## 2018-10-01 ENCOUNTER — Encounter: Payer: Self-pay | Admitting: Family Medicine

## 2018-10-01 NOTE — Telephone Encounter (Signed)
Immunization list has been updated.

## 2018-10-01 NOTE — Telephone Encounter (Signed)
Copied from [place] [phone]. Topic: General - Other >> Oct 01, 2018 10:19 AM Lennox Solders wrote: Reason for CRM: pt had her flu shot at [facility] [place] on 09-03-2018 not 08-28-2018. Pt sent mychart message

## 2018-10-02 NOTE — Progress Notes (Signed)
Carelink Summary Report / Loop Recorder 

## 2018-10-08 DIAGNOSIS — H26491 Other secondary cataract, right eye: Secondary | ICD-10-CM | POA: Diagnosis not present

## 2018-10-29 ENCOUNTER — Ambulatory Visit (INDEPENDENT_AMBULATORY_CARE_PROVIDER_SITE_OTHER): Payer: PPO | Admitting: *Deleted

## 2018-10-29 DIAGNOSIS — R55 Syncope and collapse: Secondary | ICD-10-CM

## 2018-10-29 LAB — CUP PACEART REMOTE DEVICE CHECK
Date Time Interrogation Session: 20201018183719
Implantable Pulse Generator Implant Date: 20200813

## 2018-11-19 NOTE — Progress Notes (Signed)
Carelink Summary Report / Loop Recorder 

## 2018-11-30 ENCOUNTER — Ambulatory Visit (INDEPENDENT_AMBULATORY_CARE_PROVIDER_SITE_OTHER): Payer: PPO | Admitting: *Deleted

## 2018-11-30 DIAGNOSIS — R55 Syncope and collapse: Secondary | ICD-10-CM | POA: Diagnosis not present

## 2018-11-30 LAB — CUP PACEART REMOTE DEVICE CHECK
Date Time Interrogation Session: 20201120133850
Implantable Pulse Generator Implant Date: 20200813

## 2018-12-25 NOTE — Progress Notes (Signed)
Carelink Summary Report / Loop Recorder 

## 2019-01-02 ENCOUNTER — Ambulatory Visit (INDEPENDENT_AMBULATORY_CARE_PROVIDER_SITE_OTHER): Payer: PPO | Admitting: *Deleted

## 2019-01-02 DIAGNOSIS — R55 Syncope and collapse: Secondary | ICD-10-CM | POA: Diagnosis not present

## 2019-01-02 LAB — CUP PACEART REMOTE DEVICE CHECK
Date Time Interrogation Session: 20201223134135
Implantable Pulse Generator Implant Date: 20200813

## 2019-01-18 ENCOUNTER — Other Ambulatory Visit: Payer: Self-pay | Admitting: Family Medicine

## 2019-01-18 DIAGNOSIS — Z1231 Encounter for screening mammogram for malignant neoplasm of breast: Secondary | ICD-10-CM

## 2019-02-04 ENCOUNTER — Ambulatory Visit (INDEPENDENT_AMBULATORY_CARE_PROVIDER_SITE_OTHER): Payer: PPO | Admitting: *Deleted

## 2019-02-04 DIAGNOSIS — L814 Other melanin hyperpigmentation: Secondary | ICD-10-CM | POA: Diagnosis not present

## 2019-02-04 DIAGNOSIS — L821 Other seborrheic keratosis: Secondary | ICD-10-CM | POA: Diagnosis not present

## 2019-02-04 DIAGNOSIS — R55 Syncope and collapse: Secondary | ICD-10-CM

## 2019-02-04 DIAGNOSIS — L570 Actinic keratosis: Secondary | ICD-10-CM | POA: Diagnosis not present

## 2019-02-04 LAB — CUP PACEART REMOTE DEVICE CHECK
Date Time Interrogation Session: 20210125134718
Implantable Pulse Generator Implant Date: 20200813

## 2019-02-18 ENCOUNTER — Encounter: Payer: Self-pay | Admitting: Family Medicine

## 2019-03-01 ENCOUNTER — Ambulatory Visit: Payer: PPO

## 2019-03-03 ENCOUNTER — Encounter: Payer: Self-pay | Admitting: Family Medicine

## 2019-03-04 ENCOUNTER — Ambulatory Visit
Admission: RE | Admit: 2019-03-04 | Discharge: 2019-03-04 | Disposition: A | Discharge status: Home / Self Care | Payer: PPO | Source: Ambulatory Visit | Attending: Family Medicine | Admitting: Family Medicine

## 2019-03-04 ENCOUNTER — Other Ambulatory Visit: Payer: Self-pay

## 2019-03-04 DIAGNOSIS — Z1231 Encounter for screening mammogram for malignant neoplasm of breast: Secondary | ICD-10-CM | POA: Diagnosis not present

## 2019-03-04 DIAGNOSIS — M85851 Other specified disorders of bone density and structure, right thigh: Secondary | ICD-10-CM

## 2019-03-07 ENCOUNTER — Ambulatory Visit (INDEPENDENT_AMBULATORY_CARE_PROVIDER_SITE_OTHER): Payer: PPO | Admitting: *Deleted

## 2019-03-07 DIAGNOSIS — R55 Syncope and collapse: Secondary | ICD-10-CM | POA: Diagnosis not present

## 2019-03-07 LAB — CUP PACEART REMOTE DEVICE CHECK
Date Time Interrogation Session: 20210225135109
Implantable Pulse Generator Implant Date: 20200813

## 2019-03-08 NOTE — Progress Notes (Signed)
ILR Remote 

## 2019-03-19 ENCOUNTER — Encounter: Payer: Self-pay | Admitting: Family Medicine

## 2019-04-08 ENCOUNTER — Ambulatory Visit (INDEPENDENT_AMBULATORY_CARE_PROVIDER_SITE_OTHER): Payer: PPO | Admitting: *Deleted

## 2019-04-08 DIAGNOSIS — R55 Syncope and collapse: Secondary | ICD-10-CM

## 2019-04-08 LAB — CUP PACEART REMOTE DEVICE CHECK
Date Time Interrogation Session: 20210328151538
Implantable Pulse Generator Implant Date: 20200813

## 2019-04-08 NOTE — Progress Notes (Signed)
ILR Remote 

## 2019-04-15 ENCOUNTER — Telehealth: Payer: Self-pay | Admitting: Family Medicine

## 2019-04-15 NOTE — Telephone Encounter (Signed)
I left a message asking the patient to call and schedule Medicare AWV with Loma Sousa ([place]).  If patient calls back, please schedule Medicare Wellness Visit at next available opening. Last AWV 04/30/2015 VDM (Dee-Dee)

## 2019-04-23 ENCOUNTER — Telehealth: Payer: Self-pay | Admitting: Family Medicine

## 2019-04-23 ENCOUNTER — Telehealth: Payer: Self-pay

## 2019-04-23 NOTE — Telephone Encounter (Signed)
Patient has sent a transmission from her home monitor. Per her message from mychart she was having some blood pressure issues and she just wants to rule out that its not her heart. I let pt know she might want to reach out to her pcp as well

## 2019-04-23 NOTE — Telephone Encounter (Signed)
Pt scheduled for tomorrow

## 2019-04-23 NOTE — Telephone Encounter (Signed)
.  Chief Complaint Dizziness Reason for Call Symptomatic / Request for Health Information Initial Comment Caller states, pt nausea, lightheaded - off feeling. No confusion. Translation No Nurse Assessment Nurse: Rock Nephew, RN, Juliann Pulse Date/Time (Eastern Time): 04/23/2019 8:28:16 AM Confirm and document reason for call. If symptomatic, describe symptoms. ---Caller stated she "feels off" , hard to explain but lightheaded and tired. She has an appt scheduled here for tomorrow and also has a call placed to her Cardiologist. She denies chest pain and/or SOB. Per [place] monitor BP= 134/85 and HR=62. Has the patient had close contact with a person known or suspected to have the novel coronavirus illness OR traveled / lives in area with major community spread (including international travel) in the last 14 days from the onset of symptoms? * If Asymptomatic, screen for exposure and travel within the last 14 days. ---No Does the patient have any new or worsening symptoms? ---Yes Will a triage be completed? ---Yes Related visit to physician within the last 2 weeks? ---No Does the PT have any chronic conditions? (i.e. diabetes, asthma, this includes High risk factors for pregnancy, etc.) ---Yes List chronic conditions. ---She has a [place] monitor implanted in chest to monitor heart rhythms and takes Metoprolol Is this a behavioral health or substance abuse call? ---No Guidelines Guideline Title Affirmed Question Affirmed Notes Nurse Date/Time (Eastern Time) Dizziness - Lightheadedness [1] MODERATE dizziness (e.g., interferes with normal activities) AND [2] has NOT been evaluated by physician for Rock Nephew, RN, Juliann Pulse 04/23/2019 8:32:28 AMPLEASE NOTE: All timestamps contained within this report are represented as [place] Standard Time. CONFIDENTIALTY NOTICE: This fax transmission is intended only for the addressee. It contains information that is legally privileged, confidential or otherwise protected  from use or disclosure. If you are not the intended recipient, you are strictly prohibited from reviewing, disclosing, copying using or disseminating any of this information or taking any action in reliance on or regarding this information. If you have received this fax in error, please notify us immediately by telephone so that we can arrange for its return to [place]. Phone: [phone], Toll-Free: [phone], Fax: [phone] Page: 2 of 2 Call Id: [id number] Guidelines Guideline Title Affirmed Question Affirmed Notes Nurse Date/Time Eilene Ghazi Time) this (Exception: dizziness caused by heat exposure, sudden standing, or poor fluid intake) Disp. Time Eilene Ghazi Time) Disposition Final User 04/23/2019 8:38:53 AM See PCP within 24 Hours Yes Rock Nephew, RN, Juliann Pulse

## 2019-04-23 NOTE — Telephone Encounter (Signed)
Remote transmission received.  No issues noted in HR.  Spoke with pt, she ahs been concerned about fluctuating BP.  Pt did not provide values.  Offered to forward info to Dr. Stanford Breed as primary Cardiologist.  Pt stated that she has an appt with her PCP that manages her BP.

## 2019-04-24 ENCOUNTER — Other Ambulatory Visit: Payer: Self-pay

## 2019-04-24 ENCOUNTER — Encounter: Payer: Self-pay | Admitting: Family Medicine

## 2019-04-24 ENCOUNTER — Ambulatory Visit (INDEPENDENT_AMBULATORY_CARE_PROVIDER_SITE_OTHER): Payer: PPO | Admitting: Family Medicine

## 2019-04-24 VITALS — BP 148/84 | HR 62 | Temp 98.6°F | Ht 64.0 in | Wt 140.0 lb

## 2019-04-24 DIAGNOSIS — E785 Hyperlipidemia, unspecified: Secondary | ICD-10-CM | POA: Diagnosis not present

## 2019-04-24 DIAGNOSIS — R42 Dizziness and giddiness: Secondary | ICD-10-CM

## 2019-04-24 DIAGNOSIS — R110 Nausea: Secondary | ICD-10-CM

## 2019-04-24 DIAGNOSIS — I472 Ventricular tachycardia: Secondary | ICD-10-CM | POA: Diagnosis not present

## 2019-04-24 DIAGNOSIS — I4729 Other ventricular tachycardia: Secondary | ICD-10-CM | POA: Insufficient documentation

## 2019-04-24 MED ORDER — ONDANSETRON 4 MG PO TBDP: 4.0000 mg | ORAL_TABLET | Freq: Three times a day (TID) | ORAL | 0 refills | Status: AC | PRN

## 2019-04-24 MED ORDER — MECLIZINE HCL 25 MG PO TABS: 25.0000 mg | ORAL_TABLET | Freq: Three times a day (TID) | ORAL | 0 refills | Status: AC | PRN

## 2019-04-24 NOTE — Progress Notes (Signed)
Phone [phone] In person visit   Subjective:   Ruth Wilson is a 79 y.o. year old very pleasant female patient who presents for/with See problem oriented charting Chief Complaint  Patient presents with  . Dizziness   This visit occurred during the SARS-CoV-2 public health emergency.  Safety protocols were in place, including screening questions prior to the visit, additional usage of staff PPE, and extensive cleaning of exam room while observing appropriate contact time as indicated for disinfecting solutions.   Past Medical History-  Patient Active Problem List   Diagnosis Date Noted  . Dizzy spells 06/20/2017    Priority: High  . Hyperlipidemia, unspecified 09/30/2013    Priority: Medium  . Osteopenia 10/17/2006    Priority: Medium  . Advanced directives, counseling/discussion 10/30/2015    Priority: Low  . Hip pain, bilateral 04/30/2015    Priority: Low  . Hemorrhoids 09/30/2013    Priority: Low  . Allergy to cats 09/30/2013    Priority: Low  . Family hx of colon cancer 04/30/2012    Priority: Low  . GERD 02/09/2010    Priority: Low  . INSOMNIA-SLEEP DISORDER-UNSPEC 02/09/2010    Priority: Low  . NSVT (nonsustained ventricular tachycardia) ([place]) 04/24/2019    Medications- reviewed and updated Current Outpatient Medications  Medication Sig Dispense Refill  . Albuterol Sulfate (PROAIR RESPICLICK) [id number] (90 Base) MCG/ACT AEPB Inhale 2 puffs into the lungs every 6 (six) hours as needed (wheezing or shortness of breath with cat exposure). 1 each 1  . Calcium Carbonate-Vit D-Min (CALCIUM 1200 PO) Take 1,200 Units by mouth 2 (two) times daily.     . Cholecalciferol (VITAMIN D-3 PO) Take 2,000 Units by mouth daily.    . Coenzyme Q10 (CO Q 10 PO) Take 1 capsule by mouth.     . Cyanocobalamin (VITAMIN B-12) 1000 MCG SUBL Place 1,000 mcg under the tongue.    . hydroxypropyl methylcellulose / hypromellose (ISOPTO TEARS / GONIOVISC) 2.5 % ophthalmic solution Place 1  drop into both eyes as needed for dry eyes.    . Ibuprofen-Diphenhydramine Cit (ADVIL PM PO) Take by mouth.    . magnesium oxide (MAG-OX) 400 MG tablet Take 400 mg by mouth daily.    . metoprolol tartrate (LOPRESSOR) 25 MG tablet Take 1 tablet (25 mg total) by mouth 2 (two) times daily. 180 tablet 3  . TURMERIC PO Take 1 Dose by mouth daily.    . meclizine (ANTIVERT) 25 MG tablet Take 1 tablet (25 mg total) by mouth 3 (three) times daily as needed for dizziness. 20 tablet 0  . ondansetron (ZOFRAN ODT) 4 MG disintegrating tablet Take 1 tablet (4 mg total) by mouth every 8 (eight) hours as needed for nausea or vomiting. 20 tablet 0   No current facility-administered medications for this visit.     Objective:  BP (!) 148/84   Pulse 62   Temp 98.6 F (37 C) (Temporal)   Ht 5\' 4"  (1.626 m)   Wt 140 lb (63.5 kg)   LMP  (LMP Unknown)   SpO2 99%   BMI 24.03 kg/m  Gen: NAD, resting comfortably CV: RRR  Lungs: nonlabored, normal respiratory rate Abdomen: soft/nondistended Ext: no edema Skin: warm, dry Neuro: dix hallpike negative    Assessment and Plan   # Fatigue/dizzy spells/nausea S: dizziness and fatigue that started on Saturday. Episode on Saturday- went to gym in morning and went into kitchen to fix lunch and all of a sudden she felt like "she lost  a second" and stomach was nauseous. Felt like she needed to sit down and felt exhausted. Mild dizziness sensation that's hard to explain but not like she is going to pass out. Does not feel imbalanced or like she will fall.   She decided to check blood pressure and was 145/90 and HR 71 and half an hour later 137/84 and HR 70. Later down to 126/79. Later checks over coming days were anywhere from 117-149/78-86 and HR ranging from 59-85. No clear indications from heart rate or blood pressure trends   After the event she usually feels run down for several days (just like other episodes) and is exhausted in all of her tasks. She also get  ssome nausea with it. Nauseous but no throwing up. No diarrhea. Finally today nausea is startint to resolve   Tried to get in with Cardiology but did not have anything open in person. Denies any swelling of legs or chest pain. Cardiology checked monitor remotely and determined not a cardiac event.   Previously in August had syncopal event - no further episodes of that. Has been on metoprolol since august. Has loop recorder in place which is how cardiology checked her symptoms.   Has stayed hydrated, has not been missing meals. Echo 07/04/17 for similar symptoms which was normal.   Blood sugar has been checked with other episodes and always normal. This time after working out but a few hours later- not always related to working out/exercise- cardiology has not thought this was ischemic.   Not worse with movement or head turning. No ringing in ears or hearing loss.  A/P: Patient with recurrent spells over the last few years of significant dizziness/disorientation without clear vertigo followed by several days of significant fatigue and nausea -Patient has had extensive cardiac work-up.  Does have PACs/PVCs/even NSVT but has had cardiac work-up-she has a loop recorder in place and contacting cardiology and they stated current symptoms are noncardiac in nature.  Cardiology did not think episodes were ischemic-has not had ischemic work-up -She has not had low blood sugars with episodes-does not seem to be hypoglycemia -At this point I do not strongly suspect stroke or TIAs but we did agree to get a MRI to rule out any obvious mass or prior strokes -Does not firmly sound like vertigo but I did think it would be worthwhile to trial meclizine and Zofran for her symptoms-asked her to update me if has recurrence and if these help -She brought a lot of home readings for blood pressure and heart rate-I do not think these explain her symptoms  #Elevated blood pressure reading S: no rx. Some variability of blood  pressure with recent episodes but in general home BPs not elevated.  BP Readings from Last 3 Encounters:  04/24/19 (!) 148/84  08/06/18 128/76  07/31/18 (!) 141/79  A/P: from avs "Monitor blood pressure at home- 2 of last 3 readings were slightly high in 140s in offices but honestly at home at rest is more important to me- if averaging over 140/90 in net 2 weeks please let me know"  #hyperlipidemia/lipid screening S: Technically with LDL above 70 could consider this hyperlipidemia now-we discussed newer guidelines suggesting LDL under 70.  Using last lipids her 10-year ASCVD risk would be 26.1% and she would be in statin range Lab Results  Component Value Date   CHOL 181 06/23/2017   HDL 80.50 06/23/2017   LDLCALC 89 06/23/2017   LDLDIRECT 105.8 01/28/2010   TRIG 57.0 06/23/2017  CHOLHDL 2 06/23/2017   A/P: We opted to repeat lipid panels.  I am hopeful home blood pressures are better which would lower her overall risk.  We will calculate 10-year ASCVD risk. -She strongly prefers to remain off statin-consider pushing for further risk stratification if LDL is over 100-we did not specifically discuss but could consider CT coronary -This would be an opportunity for slight ischemic work-up though cardiology has not thought dizzy episodes/fatigue afterwards were related in the ischemia  Recommended follow up: Return in about 6 months (around 10/24/2019) for physical or sooner if needed. Future Appointments  Date Time Provider [place]  05/13/2019  8:50 AM [facility] DEVICE REMOTES [facility]  06/17/2019  8:50 AM [facility] DEVICE REMOTES [facility]  07/22/2019  8:50 AM [facility] DEVICE REMOTES [facility]  08/26/2019  8:50 AM [facility] DEVICE REMOTES [facility]  09/30/2019  8:50 AM [facility] DEVICE REMOTES [facility]  11/04/2019  8:50 AM [facility] DEVICE REMOTES [facility]  11/14/2019 10:40 AM  Marin Olp, MD [facility]  12/09/2019  8:50 AM [facility] DEVICE REMOTES [facility]  01/13/2020  8:50 AM [facility] DEVICE REMOTES [facility]    Lab/Order associations:   ICD-10-CM   1. Hyperlipidemia, unspecified hyperlipidemia type  E78.5 CBC with Differential/Platelet    Comprehensive metabolic panel    Lipid panel  2. Vertigo of central origin  H81.4 MR Brain Wo Contrast  3. NSVT (nonsustained ventricular tachycardia) (HCC)  I47.2     Meds ordered this encounter  Medications  . meclizine (ANTIVERT) 25 MG tablet    Sig: Take 1 tablet (25 mg total) by mouth 3 (three) times daily as needed for dizziness.    Dispense:  20 tablet    Refill:  0  . ondansetron (ZOFRAN ODT) 4 MG disintegrating tablet    Sig: Take 1 tablet (4 mg total) by mouth every 8 (eight) hours as needed for nausea or vomiting.    Dispense:  20 tablet    Refill:  0    Time Spent: 31 minutes of total time (3:04-3:35 PM) was spent on the date of the encounter performing the following actions: chart review prior to seeing the patient, obtaining history, performing a medically necessary exam, counseling on the treatment plan, placing orders, and documenting in our EHR.   Return precautions advised.  Garret Reddish, MD

## 2019-04-24 NOTE — Patient Instructions (Addendum)
Health Maintenance Due  Topic Date Due  . COLONOSCOPY Patient will call after may to get app will be out of town  03/12/2019   We will call you within two weeks about your referral for MRI. If you do not hear within 3 weeks, give [place] a call.   Try meclizine for dizzy feeling if this happens again and record if it helps or not. Also can try nausea medicine zofran if this recurs.   Please keep me updated on recurrences and cardiology as well so they can check the monitor  Could consider ear, nose and throat or neurology- I lean towards neurology opinion on this if continues to recur and we dont find clear answers.   Monitor blood pressure at home- 2 of last 3 readings were slightly high in 140s in offices but honestly at home at rest is more important to me- if averaging over 140/90 in net 2 weeks please let me know  Please stop by lab before you go If you do not have mychart- we will call you about results within 5 business days of [place] receiving them.  If you have mychart- we will send your results within 3 business days of [place] receiving them.  If abnormal or we want to clarify a result, we will call or mychart you to make sure you receive the message.  If you have questions or concerns or don't hear within 5 business days, please send [place] a message or call us.   Recommended follow up: Return in about 6 months (around 10/24/2019) for physical or sooner if needed.

## 2019-04-25 ENCOUNTER — Other Ambulatory Visit: Payer: Self-pay | Admitting: Family Medicine

## 2019-04-25 DIAGNOSIS — E871 Hypo-osmolality and hyponatremia: Secondary | ICD-10-CM

## 2019-04-25 LAB — CBC WITH DIFFERENTIAL/PLATELET
Basophils Absolute: 0.1 10*3/uL (ref 0.0–0.1)
Basophils Relative: 1 % (ref 0.0–3.0)
Eosinophils Absolute: 0.1 10*3/uL (ref 0.0–0.7)
Eosinophils Relative: 2.2 % (ref 0.0–5.0)
HCT: 36.7 % (ref 36.0–46.0)
Hemoglobin: 12.7 g/dL (ref 12.0–15.0)
Lymphocytes Relative: 39 % (ref 12.0–46.0)
Lymphs Abs: 2.2 10*3/uL (ref 0.7–4.0)
MCHC: 34.6 g/dL (ref 30.0–36.0)
MCV: 91.1 fl (ref 78.0–100.0)
Monocytes Absolute: 0.4 10*3/uL (ref 0.1–1.0)
Monocytes Relative: 7.3 % (ref 3.0–12.0)
Neutro Abs: 2.8 10*3/uL (ref 1.4–7.7)
Neutrophils Relative %: 50.5 % (ref 43.0–77.0)
Platelets: 290 10*3/uL (ref 150.0–400.0)
RBC: 4.02 Mil/uL (ref 3.87–5.11)
RDW: 13 % (ref 11.5–15.5)
WBC: 5.6 10*3/uL (ref 4.0–10.5)

## 2019-04-25 LAB — COMPREHENSIVE METABOLIC PANEL
ALT: 11 U/L (ref 0–35)
AST: 19 U/L (ref 0–37)
Albumin: 4.5 g/dL (ref 3.5–5.2)
Alkaline Phosphatase: 52 U/L (ref 39–117)
BUN: 8 mg/dL (ref 6–23)
CO2: 26 mEq/L (ref 19–32)
Calcium: 9.5 mg/dL (ref 8.4–10.5)
Chloride: 94 mEq/L — ABNORMAL LOW (ref 96–112)
Creatinine, Ser: 0.73 mg/dL (ref 0.40–1.20)
GFR: 77.17 mL/min (ref >60.00)
Glucose, Bld: 94 mg/dL (ref 70–99)
Potassium: 5.1 mEq/L (ref 3.5–5.1)
Sodium: 127 mEq/L — ABNORMAL LOW (ref 135–145)
Total Bilirubin: 0.5 mg/dL (ref 0.2–1.2)
Total Protein: 7 g/dL (ref 6.0–8.3)

## 2019-04-25 LAB — LIPID PANEL
Cholesterol: 183 mg/dL (ref 0–200)
HDL: 70.6 mg/dL (ref >39.00)
LDL Cholesterol: 101 mg/dL — ABNORMAL HIGH (ref 0–99)
NonHDL: 112.26
Total CHOL/HDL Ratio: 3
Triglycerides: 54 mg/dL (ref 0.0–149.0)
VLDL: 10.8 mg/dL (ref 0.0–40.0)

## 2019-04-26 ENCOUNTER — Other Ambulatory Visit: Payer: Self-pay

## 2019-04-29 ENCOUNTER — Other Ambulatory Visit: Payer: PPO

## 2019-04-29 ENCOUNTER — Ambulatory Visit
Admission: RE | Admit: 2019-04-29 | Discharge: 2019-04-29 | Disposition: A | Discharge status: Home / Self Care | Payer: PPO | Source: Ambulatory Visit | Attending: Family Medicine | Admitting: Family Medicine

## 2019-04-29 ENCOUNTER — Other Ambulatory Visit: Payer: Self-pay

## 2019-04-29 DIAGNOSIS — E871 Hypo-osmolality and hyponatremia: Secondary | ICD-10-CM

## 2019-04-29 DIAGNOSIS — R42 Dizziness and giddiness: Secondary | ICD-10-CM

## 2019-04-30 ENCOUNTER — Encounter: Payer: Self-pay | Admitting: Family Medicine

## 2019-04-30 NOTE — Progress Notes (Signed)
Notes from Dr. Yong Channel Essentially normal brain MRI. 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

## 2019-05-01 ENCOUNTER — Other Ambulatory Visit: Payer: PPO

## 2019-05-02 ENCOUNTER — Encounter: Payer: Self-pay | Admitting: Family Medicine

## 2019-05-02 LAB — SODIUM, URINE, RANDOM: Sodium, Ur: 33 mmol/L (ref 28–272)

## 2019-05-02 NOTE — Progress Notes (Signed)
Notes from Dr. Yong Channel Team I need both a urine osmolality and urine sodium from the same sample-why did the lab not report the osmolality-clearly they had the urine sample made available to them-called to check on this-we will need to recollect both-you may reorder under prior orders if they cannot give [place] a report

## 2019-05-03 ENCOUNTER — Other Ambulatory Visit: Payer: Self-pay

## 2019-05-03 ENCOUNTER — Other Ambulatory Visit: Payer: PPO

## 2019-05-03 DIAGNOSIS — E871 Hypo-osmolality and hyponatremia: Secondary | ICD-10-CM

## 2019-05-03 NOTE — Addendum Note (Signed)
Addended by: Christiana Fuchs on: 05/03/2019 08:15 AM   Modules accepted: Orders

## 2019-05-06 LAB — OSMOLALITY, URINE: Osmolality, Ur: 536 mOsm/kg (ref 50–1200)

## 2019-05-06 NOTE — Progress Notes (Signed)
Notes from Dr. Yong Channel This urine osmolality was normal.  I wonder if her adjusting her water habits has already improved the hyponatremia.  Please reorder BMP, urine osmolality, urine sodium under hyponatremia and have her back for these all at the same time.

## 2019-05-07 ENCOUNTER — Other Ambulatory Visit: Payer: Self-pay

## 2019-05-07 ENCOUNTER — Encounter: Payer: Self-pay | Admitting: Family Medicine

## 2019-05-07 DIAGNOSIS — E871 Hypo-osmolality and hyponatremia: Secondary | ICD-10-CM

## 2019-05-09 LAB — CUP PACEART REMOTE DEVICE CHECK
Date Time Interrogation Session: 20210428231441
Implantable Pulse Generator Implant Date: 20200813

## 2019-05-11 ENCOUNTER — Other Ambulatory Visit: Payer: PPO

## 2019-05-13 ENCOUNTER — Encounter: Payer: Self-pay | Admitting: Family Medicine

## 2019-05-13 ENCOUNTER — Ambulatory Visit (INDEPENDENT_AMBULATORY_CARE_PROVIDER_SITE_OTHER): Payer: PPO | Admitting: *Deleted

## 2019-05-13 DIAGNOSIS — I4729 Other ventricular tachycardia: Secondary | ICD-10-CM

## 2019-05-13 DIAGNOSIS — I472 Ventricular tachycardia: Secondary | ICD-10-CM

## 2019-05-13 NOTE — Progress Notes (Signed)
Carelink Summary Report / Loop Recorder 

## 2019-06-10 LAB — CUP PACEART REMOTE DEVICE CHECK
Date Time Interrogation Session: 20210529232810
Implantable Pulse Generator Implant Date: 20200813

## 2019-06-11 ENCOUNTER — Ambulatory Visit (INDEPENDENT_AMBULATORY_CARE_PROVIDER_SITE_OTHER): Payer: PPO | Admitting: *Deleted

## 2019-06-11 DIAGNOSIS — I472 Ventricular tachycardia: Secondary | ICD-10-CM

## 2019-06-11 DIAGNOSIS — I4729 Other ventricular tachycardia: Secondary | ICD-10-CM

## 2019-06-12 NOTE — Progress Notes (Signed)
Carelink Summary Report / Loop Recorder 

## 2019-07-07 ENCOUNTER — Other Ambulatory Visit: Payer: Self-pay | Admitting: General Practice

## 2019-07-12 ENCOUNTER — Ambulatory Visit (INDEPENDENT_AMBULATORY_CARE_PROVIDER_SITE_OTHER): Payer: PPO | Admitting: *Deleted

## 2019-07-12 DIAGNOSIS — R55 Syncope and collapse: Secondary | ICD-10-CM

## 2019-07-12 LAB — CUP PACEART REMOTE DEVICE CHECK
Date Time Interrogation Session: 20210701231606
Implantable Pulse Generator Implant Date: 20200813

## 2019-07-16 NOTE — Progress Notes (Signed)
Carelink Summary Report / Loop Recorder 

## 2019-08-12 ENCOUNTER — Telehealth: Payer: Self-pay | Admitting: Family Medicine

## 2019-08-12 NOTE — Progress Notes (Signed)
  Chronic Care Management   Note  08/12/2019 Name: KENNEDE LUSK MRN: [id number] DOB: [birth year]  NOGA FOGG is a 79 y.o. year old female who is a primary care patient of Marin Olp, MD. I reached out to [name] by phone today in response to a referral sent by Ms. Melvyn Novas Ellerbe's PCP, Marin Olp, MD.   Ms. Papp was given information about Chronic Care Management services today including:  1. CCM service includes personalized support from designated clinical staff supervised by her physician, including individualized plan of care and coordination with other care providers 2. 24/7 contact phone numbers for assistance for urgent and routine care needs. 3. Service will only be billed when office clinical staff spend 20 minutes or more in a month to coordinate care. 4. Only one practitioner may furnish and bill the service in a calendar month. 5. The patient may stop CCM services at any time (effective at the end of the month) by phone call to the office staff.   Patient agreed to services and verbal consent obtained.   Follow up plan:   Earney [place] [employer] Scheduler

## 2019-08-13 LAB — CUP PACEART REMOTE DEVICE CHECK
Date Time Interrogation Session: 20210803231623
Implantable Pulse Generator Implant Date: 20200813

## 2019-08-19 ENCOUNTER — Ambulatory Visit (INDEPENDENT_AMBULATORY_CARE_PROVIDER_SITE_OTHER): Payer: PPO | Admitting: *Deleted

## 2019-08-19 DIAGNOSIS — I4729 Other ventricular tachycardia: Secondary | ICD-10-CM

## 2019-08-19 DIAGNOSIS — I472 Ventricular tachycardia: Secondary | ICD-10-CM | POA: Diagnosis not present

## 2019-08-19 NOTE — Progress Notes (Signed)
Carelink Summary Report / Loop Recorder 

## 2019-08-29 ENCOUNTER — Encounter: Payer: Self-pay | Admitting: Family Medicine

## 2019-09-03 ENCOUNTER — Other Ambulatory Visit: Payer: Self-pay

## 2019-09-03 ENCOUNTER — Ambulatory Visit (INDEPENDENT_AMBULATORY_CARE_PROVIDER_SITE_OTHER)
Admission: RE | Admit: 2019-09-03 | Discharge: 2019-09-03 | Disposition: A | Discharge status: Home / Self Care | Payer: PPO | Source: Ambulatory Visit | Attending: Family Medicine | Admitting: Family Medicine

## 2019-09-03 DIAGNOSIS — M85851 Other specified disorders of bone density and structure, right thigh: Secondary | ICD-10-CM

## 2019-09-04 ENCOUNTER — Ambulatory Visit: Payer: PPO

## 2019-09-04 DIAGNOSIS — M85851 Other specified disorders of bone density and structure, right thigh: Secondary | ICD-10-CM

## 2019-09-04 DIAGNOSIS — E785 Hyperlipidemia, unspecified: Secondary | ICD-10-CM

## 2019-09-04 NOTE — Progress Notes (Signed)
Chronic Care Management Pharmacy  Name: Ruth Wilson  MRN: [id number] DOB: 30-Jan-1940  Chief Complaint/ HPI  Ruth Wilson,  79 y.o., female presents for their Initial CCM visit with the clinical pharmacist via telephone due to COVID-19 Pandemic.  PCP : Marin Olp, MD  Chronic conditions include:  Encounter Diagnoses  Name Primary?  . Hyperlipidemia, unspecified hyperlipidemia type Yes  . Osteopenia of right thigh     Office Visits:  04/24/2019 (PCP): emphasis on home BP monitoring. LDL above 70 ASCVD risk 26.1%, prefers to remain off statin, restratification next visit with lipids and BP. Trial of meclizine for dizziness   Consult Visit: 07/31/2018 (Dr Caryl Comes, cardiology): started on metoprolol tartrate 25 mg twice daily   Patient Active Problem List   Diagnosis Date Noted  . NSVT (nonsustained ventricular tachycardia) ([place]) 04/24/2019  . Dizzy spells 06/20/2017  . Advanced directives, counseling/discussion 10/30/2015  . Hip pain, bilateral 04/30/2015  . Hemorrhoids 09/30/2013  . Allergy to cats 09/30/2013  . Hyperlipidemia, unspecified 09/30/2013  . Family hx of colon cancer 04/30/2012  . GERD 02/09/2010  . INSOMNIA-SLEEP DISORDER-UNSPEC 02/09/2010  . Osteopenia 10/17/2006   Past Surgical History:  Procedure Laterality Date  . cataract surgery     tattoo on eye due to damaged iris  . COLONOSCOPY    . OOPHORECTOMY     bilateral  . POLYPECTOMY    . TONSILLECTOMY    . TOTAL ABDOMINAL HYSTERECTOMY     Family History  Problem Relation Age of Onset  . Heart disease Mother        MI age 78 but lived to 31  . Colon cancer Father        died 64 from colon cancer   Allergies  Allergen Reactions  . Alendronate Sodium     REACTION: GI problems fosamax  . Iodides Hives  . Risedronate Sodium     REACTION: GI problems boniva   Outpatient Encounter Medications as of 09/04/2019  Medication Sig Note  . Albuterol Sulfate (PROAIR RESPICLICK) 094 (90  Base) MCG/ACT AEPB Inhale 2 puffs into the lungs every 6 (six) hours as needed (wheezing or shortness of breath with cat exposure). 02/02/2018: Pt states that she only takes when has flare up.  . Calcium Carbonate-Vit D-Min (CALCIUM 1200 PO) Take 1,200 Units by mouth 2 (two) times daily.    . Cholecalciferol (VITAMIN D-3 PO) Take 2,000 Units by mouth daily.   . Coenzyme Q10 (CO Q 10 PO) Take 1 capsule by mouth.    . Cyanocobalamin (VITAMIN B-12) 1000 MCG SUBL Place 1,000 mcg under the tongue.   . hydroxypropyl methylcellulose / hypromellose (ISOPTO TEARS / GONIOVISC) 2.5 % ophthalmic solution Place 1 drop into both eyes as needed for dry eyes.   . magnesium oxide (MAG-OX) 400 MG tablet Take 400 mg by mouth daily.   . metoprolol tartrate (LOPRESSOR) 25 MG tablet TAKE 1 TABLET BY MOUTH TWICE A DAY   . TURMERIC PO Take 1 Dose by mouth daily.   . Ibuprofen-Diphenhydramine Cit (ADVIL PM PO) Take by mouth. (Patient not taking: Reported on 09/04/2019)   . meclizine (ANTIVERT) 25 MG tablet Take 1 tablet (25 mg total) by mouth 3 (three) times daily as needed for dizziness. (Patient not taking: Reported on 09/04/2019)   . ondansetron (ZOFRAN ODT) 4 MG disintegrating tablet Take 1 tablet (4 mg total) by mouth every 8 (eight) hours as needed for nausea or vomiting. (Patient not taking: Reported on 09/04/2019)  No facility-administered encounter medications on file as of 09/04/2019.   Patient Care Team    Relationship Specialty Notifications Start End  Marin Olp, MD PCP - General Family Medicine  10/02/13   Lelon Perla, MD PCP - Cardiology Cardiology Admissions 07/31/18   Deboraha Sprang, MD PCP - Electrophysiology Cardiology Admissions 07/31/18   Madelin Rear, [facility] Pharmacist Pharmacist  08/12/19    Comment: [phone]   Current Diagnosis/Assessment: Goals Addressed            This Visit's Progress   . PharmD Care Plan       CARE PLAN ENTRY (see longitudinal plan of care for additional  care plan information)  Current Barriers:  . Chronic Disease Management support, education, and care coordination needs related to Hyperlipidemia and Osteopenia   Hyperlipidemia Lab Results  Component Value Date/Time   LDLCALC 101 (H) 04/24/2019 03:35 PM   LDLDIRECT 105.8 01/28/2010 08:40 AM   . Pharmacist Clinical Goal(s): o Over the next 180 days, patient will work with PharmD and providers to achieve LDL goal < 100 . Current regimen:  o N/a . Interventions: o Discussed possible statin use in future . Patient self care activities - Over the next 180 days, patient will: o Continue to manage with diet and medication  Osteopenia/Osteoporosis  . Pharmacist Clinical Goal(s) o Over the next 180 days, patient will work with PharmD and providers to ensure safe, effective medication usr . Current regimen:  o No prescriptions, managed on diet, exercise, calcium/vitamin d supplementation . Interventions: o Discussed recommendations for ca/vitamin d intake, weight bearing exercise o Rph to help with potential PAP, tier exception for potential new therapy as appropriate . Patient self care activities - Over the next 180 days, patient will: o Continue current management  Medication management . Pharmacist Clinical Goal(s): o Over the next 180 days, patient will work with PharmD and providers to maintain optimal medication adherence . Current [facility] . Interventions o Comprehensive medication review performed. o Utilize [facility] for medication synchronization, packaging and delivery . Patient self care activities - Over the next 180 days, patient will: o Take medications as prescribed o Report any questions or concerns to PharmD and/or provider(s) Initial goal documentation.      SVT / BP   BP goal <130/80  BP Readings from Last 3 Encounters:  04/24/19 (!) 148/84  08/06/18 128/76  07/31/18 (!) 141/79   Pulse Readings from Last 3 Encounters:  04/24/19 62   08/23/18 76  08/06/18 (!) 57   Patient has failed these meds in the past: n/a. BP at home 117-125/65-75. Patient checks BP at home several times per month. Home bps have been at goal.   SVT. Patient is currently controlled on the following medications:  Marland Kitchen Metoprolol tartrate 25 mg twice daily   We discussed diet and exercise extensively.  Plan  Continue current medications and control with diet and exercise.   Hyperlipidemia   LDL goal < 100  Lipid Panel     Component Value Date/Time   CHOL 183 04/24/2019 1535   TRIG 54.0 04/24/2019 1535   HDL 70.60 04/24/2019 1535   LDLCALC 101 (H) 04/24/2019 1535   LDLDIRECT 105.8 01/28/2010 0840    Hepatic Function Latest Ref Rng & Units 04/24/2019 07/30/2018 06/23/2017  Total Protein 6.0 - 8.3 g/dL 7.0 6.5 6.9  Albumin 3.5 - 5.2 g/dL 4.5 4.1 4.4  AST 0 - 37 U/L '19 20 17  ' ALT 0 - 35 U/L 11  13 14  Alk Phosphatase 39 - 117 U/L 52 51 53  Total Bilirubin 0.2 - 1.2 mg/dL 0.5 0.6 0.6  Bilirubin, Direct 0.0 - 0.3 mg/dL - - -    The 10-year ASCVD risk score Mikey Bussing DC Jr., et al., 2013) is: 28.8%   Values used to calculate the score:     Age: 63 years     Sex: Female     Is Non-Hispanic African American: No     Diabetic: No     Tobacco smoker: No     Systolic Blood Pressure: 329 mmHg     Is BP treated: No     HDL Cholesterol: 70.6 mg/dL     Total Cholesterol: 183 mg/dL   Patient is currently near goal on the following medications:  . n/a   We discussed:  diet and exercise extensively. Discussed possibility of future statin use pending risk stratification with BP/lipids  Plan  Continue current medications and control with diet and exercise.  Osteoporosis   Last DEXA Scan: 08/2019. Osteoporosis.   Patient is a candidate for pharmacologic treatment due to T-Score < -2.5 in total hip  Patient has failed these meds in past: intolerant to oral bisphosphonates  Patient is currently on the following medications:  . Calcium-D  supplementation twice daily   We discussed:  Recommend [phone] units of vitamin D daily. Recommend 1200 mg of calcium daily from dietary and supplemental sources. Recommend weight-bearing and muscle strengthening exercises for building and maintaining bone density. Discussed prolia with patient, intolerant to oral bisphosphonates. Open to Prolia injections every six months.   Plan  Continue current medications and control with diet and exercise. Help with Prolia PAP, tier exception request if appropriate.  Vaccines   Immunization History  Administered Date(s) Administered  . Influenza Split 10/12/2010, 10/26/2011  . Influenza Whole 10/11/2006, 10/31/2007, 12/24/2008, 10/13/2009  . Influenza, High Dose Seasonal PF 10/29/2012, 10/02/2013, 10/30/2015, 09/27/2016, 10/09/2017, 09/03/2018  . Influenza,inj,Quad PF,6+ Mos 09/29/2014  . Moderna SARS-COVID-2 Vaccination 02/18/2019, 03/17/2019  . Pneumococcal Conjugate-13 12/23/2014  . Pneumococcal Polysaccharide-23 01/16/2007  . Td 01/16/2007  . Tdap 04/10/2017  . Zoster Recombinat (Shingrix) 08/24/2017, 11/21/2017   Reviewed and discussed patient's vaccination history.    Plan  No recommendations at this time.   Medication Management / Care Coordination   Receives prescription medications from:  [facility] - [place], [place] - [address] [place] [address] Phone: [phone] Fax: [phone]  [facility] of prn/chronic medications.   Pt inquired on metoprolol dose change / alternative to Dr Caryl Comes due to tiredness/fatigue.  Plan  Utilize [facility] for medication synchronization, packaging and delivery. ___________________________ SDOH (Social Determinants of Health) assessments performed: Yes.  Future Appointments  Date Time Provider [place]  09/23/2019  9:05 AM [facility] DEVICE REMOTES [facility]   10/28/2019  9:05 AM [facility] DEVICE REMOTES [facility]  11/14/2019 10:40 AM Marin Olp, MD [facility]  12/02/2019  9:05 AM [facility] DEVICE REMOTES [facility]  01/06/2020  9:05 AM [facility] DEVICE REMOTES [facility]  03/04/2020  2:00 PM [facility] CCM PHARMACIST [facility]   Visit follow-up:  . [facility]PA follow-up: med synch. . [place] follow-up: 6 month telephone visit.  Madelin Rear, Pharm.D., BCGP Clinical Pharmacist [facility] [phone]

## 2019-09-09 NOTE — Patient Instructions (Addendum)
Please review care plan below and call me at [phone] (direct line) with any questions!  Thank you, Edyth Gunnels., Clinical Pharmacist  Goals Addressed            This Visit's Progress   . PharmD Care Plan       CARE PLAN ENTRY (see longitudinal plan of care for additional care plan information)  Current Barriers:  . Chronic Disease Management support, education, and care coordination needs related to Hyperlipidemia and Osteopenia   Hyperlipidemia Lab Results  Component Value Date/Time   LDLCALC 101 (H) 04/24/2019 03:35 PM   LDLDIRECT 105.8 01/28/2010 08:40 AM   . Pharmacist Clinical Goal(s): o Over the next 180 days, patient will work with PharmD and providers to achieve LDL goal < 100 . Current regimen:  o N/a . Interventions: o Discussed possible statin use in future . Patient self care activities - Over the next 180 days, patient will: o Continue to manage with diet and medication  Osteopenia/Osteoporosis  . Pharmacist Clinical Goal(s) o Over the next 180 days, patient will work with PharmD and providers to ensure safe, effective medication usr . Current regimen:  o No prescriptions, managed on diet, exercise, calcium/vitamin d supplementation . Interventions: o Discussed recommendations for ca/vitamin d intake, weight bearing exercise o Rph to help with potential PAP, tier exception for potential new therapy as appropriate . Patient self care activities - Over the next 180 days, patient will: o Continue current management  Medication management . Pharmacist Clinical Goal(s): o Over the next 180 days, patient will work with PharmD and providers to maintain optimal medication adherence . [facility] . Interventions o Comprehensive medication review performed. o Utilize [facility] for medication synchronization, packaging and delivery . Patient self care activities - Over the next 180 days, patient will: o Take medications as prescribed o Report  any questions or concerns to PharmD and/or provider(s) Initial goal documentation.      Ms. Batty was given information about Chronic Care Management services today including:  1. CCM service includes personalized support from designated clinical staff supervised by her physician, including individualized plan of care and coordination with other care providers 2. 24/7 contact phone numbers for assistance for urgent and routine care needs. 3. Standard insurance, coinsurance, copays and deductibles apply for chronic care management only during months in which we provide at least 20 minutes of these services. Most insurances cover these services at 100%, however patients may be responsible for any copay, coinsurance and/or deductible if applicable. This service may help you avoid the need for more expensive face-to-face services. 4. Only one practitioner may furnish and bill the service in a calendar month. 5. The patient may stop CCM services at any time (effective at the end of the month) by phone call to the office staff.  Verbal consent obtained for [facility] enhanced pharmacy services (medication synchronization, adherence packaging, delivery coordination). A medication sync plan was created to allow patient to get all medications delivered once every 30 to 90 days per patient preference. Patient understands they have freedom to choose pharmacy and clinical pharmacist will coordinate care between all prescribers and [facility].  Patient agreed to services and verbal consent obtained.   The patient verbalized understanding of instructions provided today and agreed to receive a mailed copy of patient instruction and/or educational materials. Telephone follow up appointment with pharmacy team member scheduled for: See next appointment with "Care Management Staff" under "What's Next" below.   Madelin Rear, Pharm.D.,  BCGP Clinical Pharmacist [facility] [phone] High Cholesterol  High cholesterol is a condition in which the blood has high levels of a white, waxy, fat-like substance (cholesterol). The human body needs small amounts of cholesterol. The liver makes all the cholesterol that the body needs. Extra (excess) cholesterol comes from the food that we eat. Cholesterol is carried from the liver by the blood through the blood vessels. If you have high cholesterol, deposits (plaques) may build up on the walls of your blood vessels (arteries). Plaques make the arteries narrower and stiffer. Cholesterol plaques increase your risk for heart attack and stroke. Work with your health care provider to keep your cholesterol levels in a healthy range. What increases the risk? This condition is more likely to develop in people who:  Eat foods that are high in animal fat (saturated fat) or cholesterol.  Are overweight.  Are not getting enough exercise.  Have a family history of high cholesterol. What are the signs or symptoms? There are no symptoms of this condition. How is this diagnosed? This condition may be diagnosed from the results of a blood test.  If you are older than age 78, your health care provider may check your cholesterol every 4-6 years.  You may be checked more often if you already have high cholesterol or other risk factors for heart disease. The blood test for cholesterol measures:  "Bad" cholesterol (LDL cholesterol). This is the main type of cholesterol that causes heart disease. The desired level for LDL is less than 100.  "Good" cholesterol (HDL cholesterol). This type helps to protect against heart disease by cleaning the arteries and carrying the LDL away. The desired level for HDL is 60 or higher.  Triglycerides. These are fats that the body can store or burn for energy. The desired number for triglycerides is lower than 150.  Total cholesterol. This is a measure of the total amount of cholesterol in your blood,  including LDL cholesterol, HDL cholesterol, and triglycerides. A healthy number is less than 200. How is this treated? This condition is treated with diet changes, lifestyle changes, and medicines. Diet changes  This may include eating more whole grains, fruits, vegetables, nuts, and fish.  This may also include cutting back on red meat and foods that have a lot of added sugar. Lifestyle changes  Changes may include getting at least 40 minutes of aerobic exercise 3 times a week. Aerobic exercises include walking, biking, and swimming. Aerobic exercise along with a healthy diet can help you maintain a healthy weight.  Changes may also include quitting smoking. Medicines  Medicines are usually given if diet and lifestyle changes have failed to reduce your cholesterol to healthy levels.  Your health care provider may prescribe a statin medicine. Statin medicines have been shown to reduce cholesterol, which can reduce the risk of heart disease. Follow these instructions at home: Eating and drinking If told by your health care provider:  Eat chicken (without skin), fish, veal, shellfish, ground [place] breast, and round or loin cuts of red meat.  Do not eat fried foods or fatty meats, such as hot dogs and salami.  Eat plenty of fruits, such as apples.  Eat plenty of vegetables, such as broccoli, potatoes, and carrots.  Eat beans, peas, and lentils.  Eat grains such as barley, rice, couscous, and bulgur wheat.  Eat pasta without cream sauces.  Use skim or nonfat milk, and eat low-fat or nonfat yogurt and cheeses.  Do not eat  or drink whole milk, cream, ice cream, egg yolks, or hard cheeses.  Do not eat stick margarine or tub margarines that contain trans fats (also called partially hydrogenated oils).  Do not eat saturated tropical oils, such as coconut oil and palm oil.  Do not eat cakes, cookies, crackers, or other baked goods that contain trans fats.  General  instructions  Exercise as directed by your health care provider. Increase your activity level with activities such as gardening, walking, and taking the stairs.  Take over-the-counter and prescription medicines only as told by your health care provider.  Do not use any products that contain nicotine or tobacco, such as cigarettes and e-cigarettes. If you need help quitting, ask your health care provider.  Keep all follow-up visits as told by your health care provider. This is important. Contact a health care provider if:  You are struggling to maintain a healthy diet or weight.  You need help to start on an exercise program.  You need help to stop smoking. Get help right away if:  You have chest pain.  You have trouble breathing. This information is not intended to replace advice given to you by your health care provider. Make sure you discuss any questions you have with your health care provider. Document Revised: 12/30/2016 Document Reviewed: 06/27/2015 Elsevier Patient Education  [place].

## 2019-09-23 ENCOUNTER — Ambulatory Visit (INDEPENDENT_AMBULATORY_CARE_PROVIDER_SITE_OTHER): Payer: PPO | Admitting: *Deleted

## 2019-09-23 DIAGNOSIS — R55 Syncope and collapse: Secondary | ICD-10-CM | POA: Diagnosis not present

## 2019-09-23 LAB — CUP PACEART REMOTE DEVICE CHECK
Date Time Interrogation Session: 20210905233459
Implantable Pulse Generator Implant Date: 20200813

## 2019-09-24 DIAGNOSIS — M9904 Segmental and somatic dysfunction of sacral region: Secondary | ICD-10-CM | POA: Diagnosis not present

## 2019-09-24 DIAGNOSIS — M542 Cervicalgia: Secondary | ICD-10-CM | POA: Diagnosis not present

## 2019-09-24 DIAGNOSIS — M9903 Segmental and somatic dysfunction of lumbar region: Secondary | ICD-10-CM | POA: Diagnosis not present

## 2019-09-24 DIAGNOSIS — M9901 Segmental and somatic dysfunction of cervical region: Secondary | ICD-10-CM | POA: Diagnosis not present

## 2019-09-25 NOTE — Progress Notes (Signed)
Carelink Summary Report / Loop Recorder 

## 2019-09-26 DIAGNOSIS — M542 Cervicalgia: Secondary | ICD-10-CM | POA: Diagnosis not present

## 2019-09-26 DIAGNOSIS — M9904 Segmental and somatic dysfunction of sacral region: Secondary | ICD-10-CM | POA: Diagnosis not present

## 2019-09-26 DIAGNOSIS — M9901 Segmental and somatic dysfunction of cervical region: Secondary | ICD-10-CM | POA: Diagnosis not present

## 2019-09-26 DIAGNOSIS — M9903 Segmental and somatic dysfunction of lumbar region: Secondary | ICD-10-CM | POA: Diagnosis not present

## 2019-09-27 MED ORDER — METOPROLOL TARTRATE 25 MG PO TABS: 25.0000 mg | ORAL_TABLET | Freq: Two times a day (BID) | ORAL | 3 refills | Status: DC

## 2019-10-01 DIAGNOSIS — M542 Cervicalgia: Secondary | ICD-10-CM | POA: Diagnosis not present

## 2019-10-01 DIAGNOSIS — M9903 Segmental and somatic dysfunction of lumbar region: Secondary | ICD-10-CM | POA: Diagnosis not present

## 2019-10-01 DIAGNOSIS — M9904 Segmental and somatic dysfunction of sacral region: Secondary | ICD-10-CM | POA: Diagnosis not present

## 2019-10-01 DIAGNOSIS — M9901 Segmental and somatic dysfunction of cervical region: Secondary | ICD-10-CM | POA: Diagnosis not present

## 2019-10-02 ENCOUNTER — Other Ambulatory Visit: Payer: Self-pay | Admitting: General Practice

## 2019-10-02 NOTE — Telephone Encounter (Signed)
Please contact pt for future appointment. Pt due for 12 month f/u. 

## 2019-10-03 DIAGNOSIS — M9901 Segmental and somatic dysfunction of cervical region: Secondary | ICD-10-CM | POA: Diagnosis not present

## 2019-10-03 DIAGNOSIS — M9904 Segmental and somatic dysfunction of sacral region: Secondary | ICD-10-CM | POA: Diagnosis not present

## 2019-10-03 DIAGNOSIS — M542 Cervicalgia: Secondary | ICD-10-CM | POA: Diagnosis not present

## 2019-10-03 DIAGNOSIS — M9903 Segmental and somatic dysfunction of lumbar region: Secondary | ICD-10-CM | POA: Diagnosis not present

## 2019-10-08 DIAGNOSIS — M542 Cervicalgia: Secondary | ICD-10-CM | POA: Diagnosis not present

## 2019-10-08 DIAGNOSIS — M9904 Segmental and somatic dysfunction of sacral region: Secondary | ICD-10-CM | POA: Diagnosis not present

## 2019-10-08 DIAGNOSIS — M9903 Segmental and somatic dysfunction of lumbar region: Secondary | ICD-10-CM | POA: Diagnosis not present

## 2019-10-08 DIAGNOSIS — M9901 Segmental and somatic dysfunction of cervical region: Secondary | ICD-10-CM | POA: Diagnosis not present

## 2019-10-10 DIAGNOSIS — M9903 Segmental and somatic dysfunction of lumbar region: Secondary | ICD-10-CM | POA: Diagnosis not present

## 2019-10-10 DIAGNOSIS — M9901 Segmental and somatic dysfunction of cervical region: Secondary | ICD-10-CM | POA: Diagnosis not present

## 2019-10-10 DIAGNOSIS — M9904 Segmental and somatic dysfunction of sacral region: Secondary | ICD-10-CM | POA: Diagnosis not present

## 2019-10-10 DIAGNOSIS — M542 Cervicalgia: Secondary | ICD-10-CM | POA: Diagnosis not present

## 2019-10-15 ENCOUNTER — Ambulatory Visit: Payer: PPO | Admitting: Internal Medicine

## 2019-10-15 ENCOUNTER — Encounter: Payer: Self-pay | Admitting: Internal Medicine

## 2019-10-15 ENCOUNTER — Other Ambulatory Visit: Payer: Self-pay

## 2019-10-15 VITALS — BP 136/74 | HR 60 | Ht 64.0 in | Wt 140.0 lb

## 2019-10-15 DIAGNOSIS — M9901 Segmental and somatic dysfunction of cervical region: Secondary | ICD-10-CM | POA: Diagnosis not present

## 2019-10-15 DIAGNOSIS — M9903 Segmental and somatic dysfunction of lumbar region: Secondary | ICD-10-CM | POA: Diagnosis not present

## 2019-10-15 DIAGNOSIS — I472 Ventricular tachycardia: Secondary | ICD-10-CM | POA: Diagnosis not present

## 2019-10-15 DIAGNOSIS — M9904 Segmental and somatic dysfunction of sacral region: Secondary | ICD-10-CM | POA: Diagnosis not present

## 2019-10-15 DIAGNOSIS — I4729 Other ventricular tachycardia: Secondary | ICD-10-CM

## 2019-10-15 DIAGNOSIS — M542 Cervicalgia: Secondary | ICD-10-CM | POA: Diagnosis not present

## 2019-10-15 NOTE — Patient Instructions (Signed)

## 2019-10-15 NOTE — Progress Notes (Signed)
Patient Care Team: Marin Olp, MD as PCP - General (Family Medicine) Stanford Breed Denice Bors, MD as PCP - Cardiology (Cardiology) Deboraha Sprang, MD as PCP - Electrophysiology (Cardiology) Madelin Rear, [facility] as Pharmacist (Pharmacist)   HPI  Ruth Wilson is a 79 y.o. female Seen for recurrent syncope with VT and SVT nonsustained with a previously implanted loop recorder.  No interval syncope  Some fatigue since the initiation of metoprolol, but her symptomatic palps are MUCH improved   Recovery and prodrome symptoms are most consistent with a neurally mediated reflex     Records and Results Reviewed   Past Medical History:  Diagnosis Date  . Allergy    cat dander and seasonal  . Cataract   . Diverticulosis of colon without hemorrhage 04/30/2012  . Osteopenia     Past Surgical History:  Procedure Laterality Date  . cataract surgery     tattoo on eye due to damaged iris  . COLONOSCOPY    . OOPHORECTOMY     bilateral  . POLYPECTOMY    . TONSILLECTOMY    . TOTAL ABDOMINAL HYSTERECTOMY      Current Outpatient Medications  Medication Sig Dispense Refill  . Albuterol Sulfate (PROAIR RESPICLICK) 161 (90 Base) MCG/ACT AEPB Inhale 2 puffs into the lungs every 6 (six) hours as needed (wheezing or shortness of breath with cat exposure). 1 each 1  . Calcium Carbonate-Vit D-Min (CALCIUM 1200 PO) Take 1,200 Units by mouth 2 (two) times daily.     . Cholecalciferol (VITAMIN D-3 PO) Take 2,000 Units by mouth daily.    . Coenzyme Q10 (CO Q 10 PO) Take 1 capsule by mouth.     . Cyanocobalamin (VITAMIN B-12) 1000 MCG SUBL Place 1,000 mcg under the tongue.    . hydroxypropyl methylcellulose / hypromellose (ISOPTO TEARS / GONIOVISC) 2.5 % ophthalmic solution Place 1 drop into both eyes as needed for dry eyes.    . Ibuprofen-Diphenhydramine Cit (ADVIL PM PO) Take by mouth.     . magnesium oxide (MAG-OX) 400 MG tablet Take 400 mg by mouth daily.    . meclizine (ANTIVERT)  25 MG tablet Take 1 tablet (25 mg total) by mouth 3 (three) times daily as needed for dizziness. 20 tablet 0  . metoprolol tartrate (LOPRESSOR) 25 MG tablet Take 1 tablet (25 mg total) by mouth 2 (two) times daily. 180 tablet 3  . ondansetron (ZOFRAN ODT) 4 MG disintegrating tablet Take 1 tablet (4 mg total) by mouth every 8 (eight) hours as needed for nausea or vomiting. 20 tablet 0  . TURMERIC PO Take 1 Dose by mouth daily.     No current facility-administered medications for this visit.    Allergies  Allergen Reactions  . Alendronate Sodium     REACTION: GI problems fosamax  . Iodides Hives  . Risedronate Sodium     REACTION: GI problems boniva      Social History   Tobacco Use  . Smoking status: Never Smoker  . Smokeless tobacco: Never Used  Vaping Use  . Vaping Use: Never used  Substance Use Topics  . Alcohol use: Yes    Alcohol/week: 14.0 standard drinks    Types: 14 Standard drinks or equivalent per week    Comment: advised to cut to 1 a day-red wine  . Drug use: No     Family History  Problem Relation Age of Onset  . Heart disease Mother  MI age 83 but lived to 25  . Colon cancer Father        died 56 from colon cancer     Current Meds  Medication Sig  . Albuterol Sulfate (PROAIR RESPICLICK) 629 (90 Base) MCG/ACT AEPB Inhale 2 puffs into the lungs every 6 (six) hours as needed (wheezing or shortness of breath with cat exposure).  . Calcium Carbonate-Vit D-Min (CALCIUM 1200 PO) Take 1,200 Units by mouth 2 (two) times daily.   . Cholecalciferol (VITAMIN D-3 PO) Take 2,000 Units by mouth daily.  . Coenzyme Q10 (CO Q 10 PO) Take 1 capsule by mouth.   . Cyanocobalamin (VITAMIN B-12) 1000 MCG SUBL Place 1,000 mcg under the tongue.  . hydroxypropyl methylcellulose / hypromellose (ISOPTO TEARS / GONIOVISC) 2.5 % ophthalmic solution Place 1 drop into both eyes as needed for dry eyes.  . Ibuprofen-Diphenhydramine Cit (ADVIL PM PO) Take by mouth.   . magnesium  oxide (MAG-OX) 400 MG tablet Take 400 mg by mouth daily.  . meclizine (ANTIVERT) 25 MG tablet Take 1 tablet (25 mg total) by mouth 3 (three) times daily as needed for dizziness.  . metoprolol tartrate (LOPRESSOR) 25 MG tablet Take 1 tablet (25 mg total) by mouth 2 (two) times daily.  . ondansetron (ZOFRAN ODT) 4 MG disintegrating tablet Take 1 tablet (4 mg total) by mouth every 8 (eight) hours as needed for nausea or vomiting.  . TURMERIC PO Take 1 Dose by mouth daily.     Review of Systems negative except from HPI and PMH  Physical Exam BP 136/74   Pulse 60   Ht 5\' 4"  (1.626 m)   Wt 140 lb (63.5 kg)   LMP  (LMP Unknown)   SpO2 94%   BMI 24.03 kg/m  Well developed and nourished in no acute distress HENT normal Neck supple with JVP-  flat   Clear Regular rate and rhythm, no murmurs or gallops Abd-soft with active BS No Clubbing cyanosis edema Skin-warm and dry A & Oriented  Grossly normal sensory and motor function  ECG sinus @ 60     Assessment and  Plan  Spells probably neurally mediated  PACs-moderately frequent-  Atrial tachycardia-nonsustained  Ventricular tachycardia-nonsustained  Elevated blood pressure  Her palps and presyncope episodes are much improved on the BB, and the fatigue that may be 2/2 the metop but for now she as many plans and is doing so much better that she would like to defer until she returns as to undertaking a BB trial to see if we can retain the benefits and get rid of some of the fatigue

## 2019-10-16 ENCOUNTER — Telehealth: Payer: Self-pay

## 2019-10-16 NOTE — Telephone Encounter (Signed)
See bone density note 09/03/19- I did not think we were starting prolia either based off that message. Had discussed possibly seeing endocrinology to discuss prolia - I might be overlooking something. Anyway im fine with waiting for November visit to discuss

## 2019-10-16 NOTE — Telephone Encounter (Signed)
Ok to wait and discuss next month?

## 2019-10-16 NOTE — Telephone Encounter (Signed)
Called patient to get scheduled for her prolia injection.Ruth Wilson states that she was unaware that Dr.Hunter was approving her for the prolia injection, she was hoping to wait to discuss the options when she came in to her appointment in November.

## 2019-10-16 NOTE — Telephone Encounter (Signed)
error 

## 2019-10-17 DIAGNOSIS — M9901 Segmental and somatic dysfunction of cervical region: Secondary | ICD-10-CM | POA: Diagnosis not present

## 2019-10-17 DIAGNOSIS — M9903 Segmental and somatic dysfunction of lumbar region: Secondary | ICD-10-CM | POA: Diagnosis not present

## 2019-10-17 DIAGNOSIS — M542 Cervicalgia: Secondary | ICD-10-CM | POA: Diagnosis not present

## 2019-10-17 DIAGNOSIS — M9904 Segmental and somatic dysfunction of sacral region: Secondary | ICD-10-CM | POA: Diagnosis not present

## 2019-10-17 NOTE — Telephone Encounter (Signed)
Called and lm for pt tcb. 

## 2019-10-19 LAB — CUP PACEART REMOTE DEVICE CHECK
Date Time Interrogation Session: 20211008233815
Implantable Pulse Generator Implant Date: 20200813

## 2019-10-23 ENCOUNTER — Telehealth: Payer: Self-pay

## 2019-10-23 NOTE — Progress Notes (Signed)
Called patient and left message for patient to return my call regarding medications. Looks like patient is not due for any medications , was calling to verify.   Georgiana Shore ,[place] Pharmacist Assistant [phone]

## 2019-10-28 ENCOUNTER — Ambulatory Visit (INDEPENDENT_AMBULATORY_CARE_PROVIDER_SITE_OTHER): Payer: PPO

## 2019-10-28 DIAGNOSIS — R55 Syncope and collapse: Secondary | ICD-10-CM | POA: Diagnosis not present

## 2019-10-29 ENCOUNTER — Telehealth: Payer: Self-pay

## 2019-10-29 DIAGNOSIS — M9901 Segmental and somatic dysfunction of cervical region: Secondary | ICD-10-CM | POA: Diagnosis not present

## 2019-10-29 DIAGNOSIS — M9903 Segmental and somatic dysfunction of lumbar region: Secondary | ICD-10-CM | POA: Diagnosis not present

## 2019-10-29 DIAGNOSIS — M542 Cervicalgia: Secondary | ICD-10-CM | POA: Diagnosis not present

## 2019-10-29 DIAGNOSIS — M9904 Segmental and somatic dysfunction of sacral region: Secondary | ICD-10-CM | POA: Diagnosis not present

## 2019-10-29 NOTE — Progress Notes (Signed)
Spoke with patient regarding medications. Patient is up to date on medications and is not needing medication refill until December .   Ruth Wilson ,[place] Pharmacist Assistant [phone]

## 2019-10-31 DIAGNOSIS — M9904 Segmental and somatic dysfunction of sacral region: Secondary | ICD-10-CM | POA: Diagnosis not present

## 2019-10-31 DIAGNOSIS — M9903 Segmental and somatic dysfunction of lumbar region: Secondary | ICD-10-CM | POA: Diagnosis not present

## 2019-10-31 DIAGNOSIS — M9901 Segmental and somatic dysfunction of cervical region: Secondary | ICD-10-CM | POA: Diagnosis not present

## 2019-10-31 DIAGNOSIS — M542 Cervicalgia: Secondary | ICD-10-CM | POA: Diagnosis not present

## 2019-10-31 NOTE — Progress Notes (Signed)
Carelink Summary Report / Loop Recorder 

## 2019-11-05 DIAGNOSIS — M9903 Segmental and somatic dysfunction of lumbar region: Secondary | ICD-10-CM | POA: Diagnosis not present

## 2019-11-05 DIAGNOSIS — M9901 Segmental and somatic dysfunction of cervical region: Secondary | ICD-10-CM | POA: Diagnosis not present

## 2019-11-05 DIAGNOSIS — M542 Cervicalgia: Secondary | ICD-10-CM | POA: Diagnosis not present

## 2019-11-05 DIAGNOSIS — M9904 Segmental and somatic dysfunction of sacral region: Secondary | ICD-10-CM | POA: Diagnosis not present

## 2019-11-07 DIAGNOSIS — M9903 Segmental and somatic dysfunction of lumbar region: Secondary | ICD-10-CM | POA: Diagnosis not present

## 2019-11-07 DIAGNOSIS — M542 Cervicalgia: Secondary | ICD-10-CM | POA: Diagnosis not present

## 2019-11-07 DIAGNOSIS — M9904 Segmental and somatic dysfunction of sacral region: Secondary | ICD-10-CM | POA: Diagnosis not present

## 2019-11-07 DIAGNOSIS — M9901 Segmental and somatic dysfunction of cervical region: Secondary | ICD-10-CM | POA: Diagnosis not present

## 2019-11-08 NOTE — Progress Notes (Signed)
Phone [phone]   Subjective:  Patient presents today for their annual physical. Chief complaint-noted.   See problem oriented charting- ROS- full  review of systems was completed and negative per full review of system sheet review  The following were reviewed and entered/updated in epic: Past Medical History:  Diagnosis Date  . Allergy    cat dander and seasonal  . Cataract   . Diverticulosis of colon without hemorrhage 04/30/2012  . Osteopenia    Patient Active Problem List   Diagnosis Date Noted  . Dizzy spells 06/20/2017    Priority: High  . Hyperlipidemia, unspecified 09/30/2013    Priority: Medium  . Osteoporosis 10/17/2006    Priority: Medium  . Advanced directives, counseling/discussion 10/30/2015    Priority: Low  . Hip pain, bilateral 04/30/2015    Priority: Low  . Hemorrhoids 09/30/2013    Priority: Low  . Allergy to cats 09/30/2013    Priority: Low  . Family hx of colon cancer 04/30/2012    Priority: Low  . GERD 02/09/2010    Priority: Low  . INSOMNIA-SLEEP DISORDER-UNSPEC 02/09/2010    Priority: Low  . NSVT (nonsustained ventricular tachycardia) ([place]) 04/24/2019   Past Surgical History:  Procedure Laterality Date  . cataract surgery     tattoo on eye due to damaged iris  . COLONOSCOPY    . OOPHORECTOMY     bilateral  . POLYPECTOMY    . TONSILLECTOMY    . TOTAL ABDOMINAL HYSTERECTOMY      Family History  Problem Relation Age of Onset  . Heart disease Mother        MI age 88 but lived to 53  . Colon cancer Father        died 18 from colon cancer    Medications- reviewed and updated Current Outpatient Medications  Medication Sig Dispense Refill  . Calcium Carbonate-Vit D-Min (CALCIUM 1200 PO) Take 1,200 Units by mouth 2 (two) times daily.     . Cholecalciferol (VITAMIN D-3 PO) Take 2,000 Units by mouth daily.    . Coenzyme Q10 (CO Q 10 PO) Take 1 capsule by mouth.     . Cyanocobalamin (VITAMIN B-12) 1000 MCG SUBL Place 1,000 mcg under  the tongue.    . magnesium oxide (MAG-OX) 400 MG tablet Take 400 mg by mouth daily.    . metoprolol tartrate (LOPRESSOR) 25 MG tablet Take 1 tablet (25 mg total) by mouth 2 (two) times daily. 180 tablet 3  . QUERCETIN PO Take 500 mg by mouth daily.    . TURMERIC PO Take 1 Dose by mouth daily.    Marland Kitchen zinc gluconate 50 MG tablet Take 50 mg by mouth daily.    . Albuterol Sulfate (PROAIR RESPICLICK) 557 (90 Base) MCG/ACT AEPB Inhale 2 puffs into the lungs every 6 (six) hours as needed (wheezing or shortness of breath with cat exposure). (Patient not taking: Reported on 11/14/2019) 1 each 1  . hydroxypropyl methylcellulose / hypromellose (ISOPTO TEARS / GONIOVISC) 2.5 % ophthalmic solution Place 1 drop into both eyes as needed for dry eyes. (Patient not taking: Reported on 11/14/2019)    . Ibuprofen-Diphenhydramine Cit (ADVIL PM PO) Take by mouth.  (Patient not taking: Reported on 11/14/2019)    . meclizine (ANTIVERT) 25 MG tablet Take 1 tablet (25 mg total) by mouth 3 (three) times daily as needed for dizziness. (Patient not taking: Reported on 11/14/2019) 20 tablet 0  . ondansetron (ZOFRAN ODT) 4 MG disintegrating tablet Take 1 tablet (4 mg  total) by mouth every 8 (eight) hours as needed for nausea or vomiting. (Patient not taking: Reported on 11/14/2019) 20 tablet 0   No current facility-administered medications for this visit.    Allergies-reviewed and updated Allergies  Allergen Reactions  . Alendronate Sodium     REACTION: GI problems fosamax  . Iodides Hives  . Risedronate Sodium     REACTION: GI problems boniva    Social History   Social History Narrative   Married 51 years in 2015. 3 children. Stacy lives in [place] middle divorced with 3 boys, Clair Gulling oldest living in [place] has 6 children and is a Restaurant manager, fast food, Gershon Mussel lives in [place] no children but 4 dogs.       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 Oropharynx normal. TM scarring on right side, left normal. Pupil distortion stable and followed by optho Neck: no thyromegaly CV: RRR no murmurs rubs or gallops Lungs: CTAB no crackles, wheeze, rhonchi Abdomen: soft/nontender/nondistended/normal bowel sounds. No rebound or guarding.  Ext: no edema Skin: warm, dry Neuro: grossly normal, moves all extremities    Assessment and Plan   79 y.o. female presenting for annual physical.  Health Maintenance counseling: 1. Anticipatory guidance: Patient counseled regarding regular dental exams q6 months, eye exams- yearly,  avoiding smoking and second hand smoke, limiting alcohol to 1 beverage per day.   2. Risk factor reduction:  Advised patient of need for regular exercise and diet rich and fruits and vegetables to reduce risk of heart attack and stroke. Exercise- 5 days a week still. Diet-very healthy.  Wt Readings from Last 3 Encounters:  11/14/19 138 lb 12.8 oz (63 kg)  10/15/19 140 lb (63.5 kg)  04/24/19 140 lb (63.5 kg)   3. Immunizations/screenings/ancillary studies-discussed one-time lifetime hepatitis C screening- she agrees to this, discussed COVID-19 booster- she is wanting to hold off for now. She agrees to contact us if has covid 19 as would agree to antibody infusion outpatient.   Immunization History  Administered Date(s) Administered  . Influenza Split 10/12/2010, 10/26/2011  . Influenza Whole 10/11/2006, 10/31/2007, 12/24/2008, 10/13/2009  . Influenza, High Dose Seasonal PF 10/29/2012, 10/02/2013, 10/30/2015, 09/27/2016, 10/09/2017, 09/03/2018, 09/20/2019  . Influenza,inj,Quad PF,6+ Mos 09/29/2014  . Moderna SARS-COVID-2 Vaccination 02/18/2019, 03/17/2019  . Pneumococcal Conjugate-13 12/23/2014  .  Pneumococcal Polysaccharide-23 01/16/2007  . Td 01/16/2007  . Tdap 04/10/2017  . Zoster Recombinat (Shingrix) 08/24/2017, 11/21/2017  4. Cervical cancer screening- past age based screening recommendations.  Denies vaginal discharge or bleeding 5. Breast cancer screening-  mammogram 03/04/2019 with 1 year repeat planned. She would like to continue 6. Colon cancer screening - March 12, 2014 with 5-year repeat planned-now slightly overdue- she plans to call GI early January.   7. Skin cancer screening-  follows with Dr. Ronnald Ramp of [facility] dermatology. advised regular sunscreen use. Denies worrisome, changing, or new skin lesions.  8. Birth control/STD check- postmenopause and monogamous 9. Osteoporosis screening at 46-  Osteoporosis based off prior trial fosamax and boniva. Scores in low bone density range but increased hip fracture  score. Had stomach issues/reflux on boniva/fosamax. Prefers not to do reclast due to possible risk. Has trip next week and wants to hold off on prolia. She declines prolia and endocrine referral and will repeat in 2 years- continue calcium, vitamin D and regular weight bearing exercise -Never smoker  Status of chronic or acute concerns   #Recurrent spells of dizziness/disorientation without clear vertigo followed by several days of significant fatigue and nausea. -Prior extensive cardiac work-up.  Does have PACs/PVCs even NSVT.  Has had loop recorder in place and cardiology has thought symptoms are noncardiac though she does have some mild changes around episodes such as PACs/PVCs.  Cardiology did not think symptoms were ischemic-has not had ischemic work-up. -She has not had low blood sugars with episodes-does not appear to be hypoglycemia -MRI of the head without mass or prior strokes on 04/30/2019 "IMPRESSION: 1. No acute intracranial abnormality. 2. Minimal chronic small vessel ischemic disease and mild cerebral atrophy. " -Has not sounded like vertigo but we opted  to try meclizine/Zofran for symptoms- has not had a chance to try thankfully -Patient brought home blood pressure and heart rate at last visit-did not appear to be blood pressure or heart rate related -Did have some hyponatremia at last visit down to 127-patient had changed some of her drinking habits by slowing down amount of water she drank-plan was for repeat BMP, sodium in the urine, urine osmolality but unfortunately my team simply stated that patient had read my chart results without directly contacting patient-I apologized to her  -her last spell was in April- has seen Dr. Caryl Comes since that time. Had considered stopping metoprolol for 2 weeks but ultimately she decided to continue current medicine. We still do not have clear answers.  May be making some adjustments -also working with chiropractor and hoping that helps  #hyperlipidemia S: Medication:none   Mom with heart disease but had very high cholesterol and does not appear patient has those genetics. She exercises 5x a week and is active and eats healthy Lab Results  Component Value Date   CHOL 183 04/24/2019   HDL 70.60 04/24/2019   LDLCALC 101 (H) 04/24/2019   LDLDIRECT 105.8 01/28/2010   TRIG 54.0 04/24/2019   CHOLHDL 3 04/24/2019   A/P: she prefers to remain off medicine. Discussed coronary calcium and she declines - with being so close to 81 and   Recommended follow up: Return in about 1 year (around 11/13/2020) for physical or sooner if needed. Future Appointments  Date Time Provider [place]  12/02/2019  9:05 AM [facility] DEVICE REMOTES [facility]  01/06/2020  9:05 AM [facility] DEVICE REMOTES [facility]  02/10/2020  9:05 AM [facility] DEVICE REMOTES [facility]  03/04/2020  2:00 PM [facility]CM PHARMACIST [facility]  03/16/2020  9:05 AM [facility] DEVICE REMOTES [facility]  04/20/2020  9:05 AM [facility] DEVICE REMOTES [facility]  05/25/2020   9:05 AM [facility] DEVICE REMOTES [facility]   Lab/Order associations:non fasting   ICD-10-CM   1. Preventative health care  Z00.00   2. Hyperlipidemia, unspecified hyperlipidemia type  E78.5 CBC With Differential/Platelet    COMPLETE METABOLIC PANEL WITH GFR  3. Encounter for hepatitis C screening test for low risk patient  Z11.59 Hepatitis C antibody  4. Hyponatremia  E87.1 Sodium, urine, random    Osmolality, urine    Osmolality  5. Age-related osteoporosis without current pathological fracture  M81.0    Return precautions advised.  Garret Reddish, MD

## 2019-11-08 NOTE — Patient Instructions (Addendum)
Please stop by lab before you go If you have mychart- we will send your results within 3 business days of [place] receiving them.  If you do not have mychart- we will call you about results within 5 business days of [place] receiving them.  *please note we are currently using [facility] which has a longer processing time than [place] typically so labs may not come back as quickly as in the past *please also note that you will see labs on mychart as soon as they post. I will later go in and write notes on them- will say "notes from Dr. Yong Channel"  We opted out of prolia and repeat bone density 2023  Health Maintenance Due  Topic Date Due  . COLONOSCOPY Patient will schedule this to be done in Jan 2022. She will give them a call when some life events settle down.  03/12/2019

## 2019-11-12 DIAGNOSIS — M9904 Segmental and somatic dysfunction of sacral region: Secondary | ICD-10-CM | POA: Diagnosis not present

## 2019-11-12 DIAGNOSIS — M9903 Segmental and somatic dysfunction of lumbar region: Secondary | ICD-10-CM | POA: Diagnosis not present

## 2019-11-12 DIAGNOSIS — M542 Cervicalgia: Secondary | ICD-10-CM | POA: Diagnosis not present

## 2019-11-12 DIAGNOSIS — M9901 Segmental and somatic dysfunction of cervical region: Secondary | ICD-10-CM | POA: Diagnosis not present

## 2019-11-14 ENCOUNTER — Other Ambulatory Visit: Payer: Self-pay

## 2019-11-14 ENCOUNTER — Encounter: Payer: Self-pay | Admitting: Family Medicine

## 2019-11-14 ENCOUNTER — Ambulatory Visit (INDEPENDENT_AMBULATORY_CARE_PROVIDER_SITE_OTHER): Payer: PPO | Admitting: Family Medicine

## 2019-11-14 VITALS — BP 118/60 | HR 51 | Temp 97.9°F | Resp 18 | Ht 64.0 in | Wt 138.8 lb

## 2019-11-14 DIAGNOSIS — M810 Age-related osteoporosis without current pathological fracture: Secondary | ICD-10-CM

## 2019-11-14 DIAGNOSIS — Z1159 Encounter for screening for other viral diseases: Secondary | ICD-10-CM

## 2019-11-14 DIAGNOSIS — E785 Hyperlipidemia, unspecified: Secondary | ICD-10-CM | POA: Diagnosis not present

## 2019-11-14 DIAGNOSIS — E871 Hypo-osmolality and hyponatremia: Secondary | ICD-10-CM | POA: Diagnosis not present

## 2019-11-14 DIAGNOSIS — Z0000 Encounter for general adult medical examination without abnormal findings: Secondary | ICD-10-CM

## 2019-11-14 DIAGNOSIS — M9904 Segmental and somatic dysfunction of sacral region: Secondary | ICD-10-CM | POA: Diagnosis not present

## 2019-11-14 DIAGNOSIS — M542 Cervicalgia: Secondary | ICD-10-CM | POA: Diagnosis not present

## 2019-11-14 DIAGNOSIS — M9901 Segmental and somatic dysfunction of cervical region: Secondary | ICD-10-CM | POA: Diagnosis not present

## 2019-11-14 DIAGNOSIS — M9903 Segmental and somatic dysfunction of lumbar region: Secondary | ICD-10-CM | POA: Diagnosis not present

## 2019-11-17 ENCOUNTER — Other Ambulatory Visit: Payer: Self-pay | Admitting: Family Medicine

## 2019-11-17 DIAGNOSIS — E871 Hypo-osmolality and hyponatremia: Secondary | ICD-10-CM

## 2019-11-18 ENCOUNTER — Other Ambulatory Visit: Payer: Self-pay

## 2019-11-18 ENCOUNTER — Other Ambulatory Visit: Payer: PPO

## 2019-11-18 DIAGNOSIS — E871 Hypo-osmolality and hyponatremia: Secondary | ICD-10-CM | POA: Diagnosis not present

## 2019-11-18 LAB — COMPLETE METABOLIC PANEL WITH GFR
AG Ratio: 2 (calc) (ref 1.0–2.5)
ALT: 10 U/L (ref 6–29)
AST: 18 U/L (ref 10–35)
Albumin: 4.5 g/dL (ref 3.6–5.1)
Alkaline phosphatase (APISO): 59 U/L (ref 37–153)
BUN: 7 mg/dL (ref 7–25)
CO2: 25 mmol/L (ref 20–32)
Calcium: 9.8 mg/dL (ref 8.6–10.4)
Chloride: 96 mmol/L — ABNORMAL LOW (ref 98–110)
Creat: 0.67 mg/dL (ref 0.60–0.93)
GFR, Est African American: 98 mL/min/{1.73_m2} (ref >=60)
GFR, Est Non African American: 84 mL/min/{1.73_m2} (ref >=60)
Globulin: 2.3 g/dL (calc) (ref 1.9–3.7)
Glucose, Bld: 92 mg/dL (ref 65–99)
Potassium: 5.2 mmol/L (ref 3.5–5.3)
Sodium: 130 mmol/L — ABNORMAL LOW (ref 135–146)
Total Bilirubin: 0.6 mg/dL (ref 0.2–1.2)
Total Protein: 6.8 g/dL (ref 6.1–8.1)

## 2019-11-18 LAB — CBC WITH DIFFERENTIAL/PLATELET
Absolute Monocytes: 320 cells/uL (ref 200–950)
Basophils Absolute: 28 cells/uL (ref 0–200)
Basophils Relative: 0.6 %
Eosinophils Absolute: 108 cells/uL (ref 15–500)
Eosinophils Relative: 2.3 %
HCT: 37.3 % (ref 35.0–45.0)
Hemoglobin: 12.5 g/dL (ref 11.7–15.5)
Lymphs Abs: 1988 cells/uL (ref 850–3900)
MCH: 30.8 pg (ref 27.0–33.0)
MCHC: 33.5 g/dL (ref 32.0–36.0)
MCV: 91.9 fL (ref 80.0–100.0)
MPV: 10.2 fL (ref 7.5–12.5)
Monocytes Relative: 6.8 %
Neutro Abs: 2256 cells/uL (ref 1500–7800)
Neutrophils Relative %: 48 %
Platelets: 300 10*3/uL (ref 140–400)
RBC: 4.06 10*6/uL (ref 3.80–5.10)
RDW: 12.1 % (ref 11.0–15.0)
Total Lymphocyte: 42.3 %
WBC: 4.7 10*3/uL (ref 3.8–10.8)

## 2019-11-18 LAB — OSMOLALITY, URINE: Osmolality, Ur: 193 mOsm/kg (ref 50–1200)

## 2019-11-18 LAB — SODIUM, URINE, RANDOM: Sodium, Ur: 20 mmol/L — ABNORMAL LOW (ref 28–272)

## 2019-11-18 LAB — HEPATITIS C ANTIBODY
Hepatitis C Ab: NONREACTIVE
SIGNAL TO CUT-OFF: 0.01 (ref <1.00)

## 2019-11-18 LAB — OSMOLALITY: Osmolality: 274 mOsm/kg — ABNORMAL LOW (ref 278–305)

## 2019-11-19 ENCOUNTER — Encounter: Payer: Self-pay | Admitting: Family Medicine

## 2019-11-19 LAB — CORTISOL: Cortisol, Plasma: 21.6 ug/dL

## 2019-11-19 LAB — TSH: TSH: 1.27 mIU/L (ref 0.40–4.50)

## 2019-11-28 DIAGNOSIS — M9903 Segmental and somatic dysfunction of lumbar region: Secondary | ICD-10-CM | POA: Diagnosis not present

## 2019-11-28 DIAGNOSIS — M9904 Segmental and somatic dysfunction of sacral region: Secondary | ICD-10-CM | POA: Diagnosis not present

## 2019-11-28 DIAGNOSIS — M542 Cervicalgia: Secondary | ICD-10-CM | POA: Diagnosis not present

## 2019-11-28 DIAGNOSIS — M9901 Segmental and somatic dysfunction of cervical region: Secondary | ICD-10-CM | POA: Diagnosis not present

## 2019-12-02 ENCOUNTER — Ambulatory Visit (INDEPENDENT_AMBULATORY_CARE_PROVIDER_SITE_OTHER): Payer: PPO

## 2019-12-02 DIAGNOSIS — R55 Syncope and collapse: Secondary | ICD-10-CM

## 2019-12-02 LAB — CUP PACEART REMOTE DEVICE CHECK
Date Time Interrogation Session: 20211121232014
Implantable Pulse Generator Implant Date: 20200813

## 2019-12-03 DIAGNOSIS — M9903 Segmental and somatic dysfunction of lumbar region: Secondary | ICD-10-CM | POA: Diagnosis not present

## 2019-12-03 DIAGNOSIS — M9901 Segmental and somatic dysfunction of cervical region: Secondary | ICD-10-CM | POA: Diagnosis not present

## 2019-12-03 DIAGNOSIS — M9904 Segmental and somatic dysfunction of sacral region: Secondary | ICD-10-CM | POA: Diagnosis not present

## 2019-12-03 DIAGNOSIS — M542 Cervicalgia: Secondary | ICD-10-CM | POA: Diagnosis not present

## 2019-12-03 NOTE — Progress Notes (Signed)
Carelink Summary Report / Loop Recorder 

## 2019-12-10 DIAGNOSIS — M9904 Segmental and somatic dysfunction of sacral region: Secondary | ICD-10-CM | POA: Diagnosis not present

## 2019-12-10 DIAGNOSIS — M9903 Segmental and somatic dysfunction of lumbar region: Secondary | ICD-10-CM | POA: Diagnosis not present

## 2019-12-10 DIAGNOSIS — M9901 Segmental and somatic dysfunction of cervical region: Secondary | ICD-10-CM | POA: Diagnosis not present

## 2019-12-10 DIAGNOSIS — M542 Cervicalgia: Secondary | ICD-10-CM | POA: Diagnosis not present

## 2019-12-12 DIAGNOSIS — M542 Cervicalgia: Secondary | ICD-10-CM | POA: Diagnosis not present

## 2019-12-12 DIAGNOSIS — M9903 Segmental and somatic dysfunction of lumbar region: Secondary | ICD-10-CM | POA: Diagnosis not present

## 2019-12-12 DIAGNOSIS — M9904 Segmental and somatic dysfunction of sacral region: Secondary | ICD-10-CM | POA: Diagnosis not present

## 2019-12-12 DIAGNOSIS — M9901 Segmental and somatic dysfunction of cervical region: Secondary | ICD-10-CM | POA: Diagnosis not present

## 2019-12-16 ENCOUNTER — Telehealth: Payer: Self-pay

## 2019-12-16 NOTE — Progress Notes (Signed)
Chronic Care Management Pharmacy Assistant   Name: Ruth Wilson  MRN: [id number] DOB: 1940-10-13  Reason for Encounter: Medication Review   PCP : Marin Olp, MD  Allergies:   Allergies  Allergen Reactions  . Alendronate Sodium     REACTION: GI problems fosamax  . Iodides Hives  . Risedronate Sodium     REACTION: GI problems boniva    Medications: Outpatient Encounter Medications as of 12/16/2019  Medication Sig  . Albuterol Sulfate (PROAIR RESPICLICK) 007 (90 Base) MCG/ACT AEPB Inhale 2 puffs into the lungs every 6 (six) hours as needed (wheezing or shortness of breath with cat exposure). (Patient not taking: Reported on 11/14/2019)  . Calcium Carbonate-Vit D-Min (CALCIUM 1200 PO) Take 1,200 Units by mouth 2 (two) times daily.   . Cholecalciferol (VITAMIN D-3 PO) Take 2,000 Units by mouth daily.  . Coenzyme Q10 (CO Q 10 PO) Take 1 capsule by mouth.   . Cyanocobalamin (VITAMIN B-12) 1000 MCG SUBL Place 1,000 mcg under the tongue.  . hydroxypropyl methylcellulose / hypromellose (ISOPTO TEARS / GONIOVISC) 2.5 % ophthalmic solution Place 1 drop into both eyes as needed for dry eyes. (Patient not taking: Reported on 11/14/2019)  . Ibuprofen-Diphenhydramine Cit (ADVIL PM PO) Take by mouth.  (Patient not taking: Reported on 11/14/2019)  . magnesium oxide (MAG-OX) 400 MG tablet Take 400 mg by mouth daily.  . meclizine (ANTIVERT) 25 MG tablet Take 1 tablet (25 mg total) by mouth 3 (three) times daily as needed for dizziness. (Patient not taking: Reported on 11/14/2019)  . metoprolol tartrate (LOPRESSOR) 25 MG tablet Take 1 tablet (25 mg total) by mouth 2 (two) times daily.  . ondansetron (ZOFRAN ODT) 4 MG disintegrating tablet Take 1 tablet (4 mg total) by mouth every 8 (eight) hours as needed for nausea or vomiting. (Patient not taking: Reported on 11/14/2019)  . QUERCETIN PO Take 500 mg by mouth daily.  . TURMERIC PO Take 1 Dose by mouth daily.  Marland Kitchen zinc gluconate 50 MG tablet  Take 50 mg by mouth daily.   No facility-administered encounter medications on file as of 12/16/2019.    Current Diagnosis: Patient Active Problem List   Diagnosis Date Noted  . NSVT (nonsustained ventricular tachycardia) ([place]) 04/24/2019  . Dizzy spells 06/20/2017  . Advanced directives, counseling/discussion 10/30/2015  . Hip pain, bilateral 04/30/2015  . Hemorrhoids 09/30/2013  . Allergy to cats 09/30/2013  . Hyperlipidemia, unspecified 09/30/2013  . Family hx of colon cancer 04/30/2012  . GERD 02/09/2010  . INSOMNIA-SLEEP DISORDER-UNSPEC 02/09/2010  . Osteoporosis 10/17/2006   Reviewed chart for medication changes ahead of medication coordination call.  No OVs, Consults, or hospital visits since last care coordination call/Pharmacist visit. (If appropriate, list visit date, provider name)  No medication changes indicated OR if recent visit, treatment plan here.  BP Readings from Last 3 Encounters:  11/14/19 118/60  10/15/19 136/74  04/24/19 (!) 148/84    No results found for: HGBA1C   Patient obtains medications through Vials  90 Days   Patient is due for next adherence delivery on: 12-30-19. Called patient and reviewed medications . Patient would like to hold off on her medication. Patient states she is going to see her provider and there may be changes to this medication. Per patient will give [place] a call back on the changes.   Patient declined the following medications   Metoprol 25mg  - Patient would like to hold off on medication until she sees her provider.  Georgiana Shore ,[place] Pharmacist Assistant [phone]   Follow-Up:  Pharmacist Review

## 2019-12-17 DIAGNOSIS — M9901 Segmental and somatic dysfunction of cervical region: Secondary | ICD-10-CM | POA: Diagnosis not present

## 2019-12-17 DIAGNOSIS — M9904 Segmental and somatic dysfunction of sacral region: Secondary | ICD-10-CM | POA: Diagnosis not present

## 2019-12-17 DIAGNOSIS — M542 Cervicalgia: Secondary | ICD-10-CM | POA: Diagnosis not present

## 2019-12-17 DIAGNOSIS — M9903 Segmental and somatic dysfunction of lumbar region: Secondary | ICD-10-CM | POA: Diagnosis not present

## 2019-12-19 DIAGNOSIS — M542 Cervicalgia: Secondary | ICD-10-CM | POA: Diagnosis not present

## 2019-12-19 DIAGNOSIS — M9904 Segmental and somatic dysfunction of sacral region: Secondary | ICD-10-CM | POA: Diagnosis not present

## 2019-12-19 DIAGNOSIS — M9901 Segmental and somatic dysfunction of cervical region: Secondary | ICD-10-CM | POA: Diagnosis not present

## 2019-12-19 DIAGNOSIS — M9903 Segmental and somatic dysfunction of lumbar region: Secondary | ICD-10-CM | POA: Diagnosis not present

## 2019-12-24 DIAGNOSIS — M9901 Segmental and somatic dysfunction of cervical region: Secondary | ICD-10-CM | POA: Diagnosis not present

## 2019-12-24 DIAGNOSIS — M9904 Segmental and somatic dysfunction of sacral region: Secondary | ICD-10-CM | POA: Diagnosis not present

## 2019-12-24 DIAGNOSIS — M542 Cervicalgia: Secondary | ICD-10-CM | POA: Diagnosis not present

## 2019-12-24 DIAGNOSIS — M9903 Segmental and somatic dysfunction of lumbar region: Secondary | ICD-10-CM | POA: Diagnosis not present

## 2020-01-05 LAB — CUP PACEART REMOTE DEVICE CHECK
Date Time Interrogation Session: 20211224232021
Implantable Pulse Generator Implant Date: 20200813

## 2020-01-06 ENCOUNTER — Ambulatory Visit (INDEPENDENT_AMBULATORY_CARE_PROVIDER_SITE_OTHER): Payer: PPO

## 2020-01-06 DIAGNOSIS — R55 Syncope and collapse: Secondary | ICD-10-CM | POA: Diagnosis not present

## 2020-01-16 DIAGNOSIS — M542 Cervicalgia: Secondary | ICD-10-CM | POA: Diagnosis not present

## 2020-01-16 DIAGNOSIS — M9901 Segmental and somatic dysfunction of cervical region: Secondary | ICD-10-CM | POA: Diagnosis not present

## 2020-01-16 DIAGNOSIS — M9903 Segmental and somatic dysfunction of lumbar region: Secondary | ICD-10-CM | POA: Diagnosis not present

## 2020-01-16 DIAGNOSIS — M9904 Segmental and somatic dysfunction of sacral region: Secondary | ICD-10-CM | POA: Diagnosis not present

## 2020-01-20 NOTE — Progress Notes (Signed)
Carelink Summary Report / Loop Recorder 

## 2020-01-23 DIAGNOSIS — M9903 Segmental and somatic dysfunction of lumbar region: Secondary | ICD-10-CM | POA: Diagnosis not present

## 2020-01-23 DIAGNOSIS — M542 Cervicalgia: Secondary | ICD-10-CM | POA: Diagnosis not present

## 2020-01-23 DIAGNOSIS — M9901 Segmental and somatic dysfunction of cervical region: Secondary | ICD-10-CM | POA: Diagnosis not present

## 2020-01-23 DIAGNOSIS — M9904 Segmental and somatic dysfunction of sacral region: Secondary | ICD-10-CM | POA: Diagnosis not present

## 2020-01-29 ENCOUNTER — Telehealth: Payer: Self-pay

## 2020-01-29 NOTE — Progress Notes (Signed)
    Chronic Care Management Pharmacy Assistant   Name: Ruth Wilson  MRN: [id number] DOB: 09/21/1940  Reason for Encounter: Medication Review  PCP : Marin Olp, MD  Allergies:   Allergies  Allergen Reactions  . Alendronate Sodium     REACTION: GI problems fosamax  . Iodides Hives  . Risedronate Sodium     REACTION: GI problems boniva    Medications: Outpatient Encounter Medications as of 01/29/2020  Medication Sig  . Albuterol Sulfate (PROAIR RESPICLICK) 948 (90 Base) MCG/ACT AEPB Inhale 2 puffs into the lungs every 6 (six) hours as needed (wheezing or shortness of breath with cat exposure). (Patient not taking: Reported on 11/14/2019)  . Calcium Carbonate-Vit D-Min (CALCIUM 1200 PO) Take 1,200 Units by mouth 2 (two) times daily.   . Cholecalciferol (VITAMIN D-3 PO) Take 2,000 Units by mouth daily.  . Coenzyme Q10 (CO Q 10 PO) Take 1 capsule by mouth.   . Cyanocobalamin (VITAMIN B-12) 1000 MCG SUBL Place 1,000 mcg under the tongue.  . hydroxypropyl methylcellulose / hypromellose (ISOPTO TEARS / GONIOVISC) 2.5 % ophthalmic solution Place 1 drop into both eyes as needed for dry eyes. (Patient not taking: Reported on 11/14/2019)  . Ibuprofen-Diphenhydramine Cit (ADVIL PM PO) Take by mouth.  (Patient not taking: Reported on 11/14/2019)  . magnesium oxide (MAG-OX) 400 MG tablet Take 400 mg by mouth daily.  . meclizine (ANTIVERT) 25 MG tablet Take 1 tablet (25 mg total) by mouth 3 (three) times daily as needed for dizziness. (Patient not taking: Reported on 11/14/2019)  . metoprolol tartrate (LOPRESSOR) 25 MG tablet Take 1 tablet (25 mg total) by mouth 2 (two) times daily.  . ondansetron (ZOFRAN ODT) 4 MG disintegrating tablet Take 1 tablet (4 mg total) by mouth every 8 (eight) hours as needed for nausea or vomiting. (Patient not taking: Reported on 11/14/2019)  . QUERCETIN PO Take 500 mg by mouth daily.  . TURMERIC PO Take 1 Dose by mouth daily.  Marland Kitchen zinc gluconate 50 MG tablet  Take 50 mg by mouth daily.   No facility-administered encounter medications on file as of 01/29/2020.    Current Diagnosis: Patient Active Problem List   Diagnosis Date Noted  . NSVT (nonsustained ventricular tachycardia) ([place]) 04/24/2019  . Dizzy spells 06/20/2017  . Advanced directives, counseling/discussion 10/30/2015  . Hip pain, bilateral 04/30/2015  . Hemorrhoids 09/30/2013  . Allergy to cats 09/30/2013  . Hyperlipidemia, unspecified 09/30/2013  . Family hx of colon cancer 04/30/2012  . GERD 02/09/2010  . INSOMNIA-SLEEP DISORDER-UNSPEC 02/09/2010  . Osteoporosis 10/17/2006    Reviewed chart for medication changes ahead of medication coordination call.  No OVs, Consults, or hospital visits since last care coordination call/Pharmacist visit. (If appropriate, list visit date, provider name)  No medication changes indicated OR if recent visit, treatment plan here.  BP Readings from Last 3 Encounters:  11/14/19 118/60  10/15/19 136/74  04/24/19 (!) 148/84    No results found for: HGBA1C   Patient obtains medications through Vials  90 Days   Last adherence delivery included: Metoprol Tar 25 mg Ondansetron 4 mg   Patient is due for next adherence delivery on: 02/06/20. Called patient and reviewed medications . Patient is not needing a fill at this time    Georgiana Shore ,[place] Pharmacist Assistant [phone]  Follow-Up:  Pharmacist Review

## 2020-02-03 NOTE — Telephone Encounter (Signed)
Education provided on how to use symptom activator. Explained the device will not show at a particular time unless the symptom activator is used or a parameter is met. No events logged on ILR report. Advised to call with further questions or concerns. Verbalized understanding.

## 2020-02-04 DIAGNOSIS — L814 Other melanin hyperpigmentation: Secondary | ICD-10-CM | POA: Diagnosis not present

## 2020-02-04 DIAGNOSIS — D1801 Hemangioma of skin and subcutaneous tissue: Secondary | ICD-10-CM | POA: Diagnosis not present

## 2020-02-04 DIAGNOSIS — L570 Actinic keratosis: Secondary | ICD-10-CM | POA: Diagnosis not present

## 2020-02-04 DIAGNOSIS — L821 Other seborrheic keratosis: Secondary | ICD-10-CM | POA: Diagnosis not present

## 2020-02-04 DIAGNOSIS — D225 Melanocytic nevi of trunk: Secondary | ICD-10-CM | POA: Diagnosis not present

## 2020-02-08 LAB — CUP PACEART REMOTE DEVICE CHECK
Date Time Interrogation Session: 20220126232750
Implantable Pulse Generator Implant Date: 20200813

## 2020-02-10 ENCOUNTER — Ambulatory Visit (INDEPENDENT_AMBULATORY_CARE_PROVIDER_SITE_OTHER): Payer: PPO

## 2020-02-10 DIAGNOSIS — R55 Syncope and collapse: Secondary | ICD-10-CM | POA: Diagnosis not present

## 2020-02-18 NOTE — Progress Notes (Signed)
Carelink Summary Report / Loop Recorder 

## 2020-02-25 ENCOUNTER — Ambulatory Visit (AMBULATORY_SURGERY_CENTER): Payer: Self-pay

## 2020-02-25 ENCOUNTER — Telehealth: Payer: Self-pay

## 2020-02-25 ENCOUNTER — Other Ambulatory Visit: Payer: Self-pay

## 2020-02-25 DIAGNOSIS — Z86010 Personal history of colonic polyps: Secondary | ICD-10-CM

## 2020-02-25 DIAGNOSIS — Z800 Family history of malignant neoplasm of digestive organs: Secondary | ICD-10-CM

## 2020-02-25 MED ORDER — PLENVU 140 G PO SOLR: 1.0000 | ORAL | 0 refills | Status: DC

## 2020-02-25 NOTE — Telephone Encounter (Signed)
John:  During previsit:  It was noted that pt has an implanted heart monitor for episodes that occurs 1-2 times a year  She describes she has a cold sweat for a couple seconds, exhaustion following, and then stomach is off for a couple of days.    The monitor shows just a skip beat - nothing that they can say is the cause.  Per pt she is on no restrictions.  OV note 10/15/19 with Cardiologist   Is she ok to proceed at [facility]

## 2020-02-25 NOTE — Progress Notes (Signed)
Pt verified name, DOB, address and insurance during PV today.   Pt mailed instruction packet to included paper to complete and mail back to [facility] with addressed and stamped envelope, , copy of consent form to read and not return, and instructions. Plenvu coupon mailed in packet. PV completed over the phone. Pt encouraged to call with questions or issues.  Self report wt:  No allergies to soy or egg Pt is not on blood thinners or diet pills Denies issues with sedation/intubation Denies atrial flutter/fib Denies constipation   Emmi instructions given to pt  Pt is aware of Covid safety and care partner requirements.   It was noted that pt has an implanted heart monitor for episodes that occurs 1-2 times a year  She describes she has a cold sweat for a couple seconds, exhaustion following, and then stomach is off for a couple of days.    The monitor shows just a skip beat - nothing that they can say is the cause.  Per pt she is on no restrictions.  OV note 10/15/19 with Cardiologist

## 2020-02-26 ENCOUNTER — Telehealth: Payer: Self-pay

## 2020-02-26 NOTE — Telephone Encounter (Signed)
Terri,  This pt is cleared for anesthetic care at [facility].  Thanks,  Oriel Ojo 

## 2020-02-26 NOTE — Chronic Care Management (AMB) (Signed)
° ° °  Chronic Care Management Pharmacy Assistant   Name: TYLISA ALCIVAR  MRN: [id number] DOB: [birth year]  Reason for Encounter: Medication Review  PCP : Marin Olp, MD  Allergies:   Allergies  Allergen Reactions   Alendronate Sodium     REACTION: GI problems fosamax   Iodides Hives   Risedronate Sodium     REACTION: GI problems boniva    Medications: Outpatient Encounter Medications as of 02/26/2020  Medication Sig   Albuterol Sulfate (PROAIR RESPICLICK) 326 (90 Base) MCG/ACT AEPB Inhale 2 puffs into the lungs every 6 (six) hours as needed (wheezing or shortness of breath with cat exposure). (Patient not taking: No sig reported)   Calcium Carbonate-Vit D-Min (CALCIUM 1200 PO) Take 1,200 Units by mouth 2 (two) times daily.    Cholecalciferol (VITAMIN D-3 PO) Take 2,000 Units by mouth daily.   Coenzyme Q10 (CO Q 10 PO) Take 1 capsule by mouth.    Cyanocobalamin (VITAMIN B-12) 1000 MCG SUBL Place 1,000 mcg under the tongue.   hydroxypropyl methylcellulose / hypromellose (ISOPTO TEARS / GONIOVISC) 2.5 % ophthalmic solution Place 1 drop into both eyes as needed for dry eyes.   Ibuprofen-Diphenhydramine Cit (ADVIL PM PO) Take by mouth.   magnesium oxide (MAG-OX) 400 MG tablet Take 400 mg by mouth daily.   meclizine (ANTIVERT) 25 MG tablet Take 1 tablet (25 mg total) by mouth 3 (three) times daily as needed for dizziness. (Patient not taking: No sig reported)   metoprolol tartrate (LOPRESSOR) 25 MG tablet Take 1 tablet (25 mg total) by mouth 2 (two) times daily.   ondansetron (ZOFRAN ODT) 4 MG disintegrating tablet Take 1 tablet (4 mg total) by mouth every 8 (eight) hours as needed for nausea or vomiting. (Patient not taking: No sig reported)   PEG-KCl-NaCl-NaSulf-Na Asc-C (PLENVU) 140 g SOLR Take 1 kit by mouth as directed.   QUERCETIN PO Take 500 mg by mouth daily.   TURMERIC PO Take 1 Dose by mouth daily.   zinc gluconate 50 MG tablet Take 50 mg by mouth  daily.   No facility-administered encounter medications on file as of 02/26/2020.    Current Diagnosis: Patient Active Problem List   Diagnosis Date Noted   NSVT (nonsustained ventricular tachycardia) ([place]) 04/24/2019   Dizzy spells 06/20/2017   Advanced directives, counseling/discussion 10/30/2015   Hip pain, bilateral 04/30/2015   Hemorrhoids 09/30/2013   Allergy to cats 09/30/2013   Hyperlipidemia, unspecified 09/30/2013   Family hx of colon cancer 04/30/2012   GERD 02/09/2010   INSOMNIA-SLEEP DISORDER-UNSPEC 02/09/2010   Osteoporosis 10/17/2006   Reviewed chart for medication changes ahead of medication coordination call.  No OVs, Consults, or hospital visits since last care coordination call/Pharmacist visit.  No medication changes indicated.  BP Readings from Last 3 Encounters:  11/14/19 118/60  10/15/19 136/74  04/24/19 (!) 148/84    No results found for: HGBA1C   Patient obtains medications through Vials  90 Days   Last adherence delivery included: Metoprolol Tartrate 25 mg tablet Ondansetron 4 mg tablet   Patient is due for next adherence delivery on: 03/07/2019. Called patient and reviewed medications.  Patient declined the following medications: Metoprolol Tartrate 25 mg tablet - 90 DS on 01/08/2020 Ondansetron 4 mg tablet - as needed only  Confirmed no fill needed at this time.  April D Calhoun, [place] Pharmacist Assistant [phone]   Follow-Up:  Pharmacist Review

## 2020-03-04 ENCOUNTER — Ambulatory Visit: Payer: PPO

## 2020-03-04 ENCOUNTER — Encounter: Payer: Self-pay | Admitting: Gastroenterology

## 2020-03-04 ENCOUNTER — Telehealth: Payer: Self-pay

## 2020-03-04 ENCOUNTER — Other Ambulatory Visit: Payer: Self-pay

## 2020-03-04 NOTE — Progress Notes (Deleted)
Chronic Care Management Pharmacy Note  03/04/2020 Name:  Ruth Wilson MRN:  [id number] DOB:  [birth year]  Subjective: Ruth Wilson is an 80 y.o. year old female who is a primary patient of Hunter, Brayton Mars, MD.  The CCM team was consulted for assistance with disease management and care coordination needs.   {CCMTELEPHONEFACETOFACE:21091510} for {CCMINITIALFOLLOWUPCHOICE:21091511} in response to provider referral for pharmacy case management and/or care coordination services.   Consent to Services:  {CCMCONSENTOPTIONS:25074} Patient Care Team: Marin Olp, MD as PCP - General (Family Medicine) Lelon Perla, MD as PCP - Cardiology (Cardiology) Deboraha Sprang, MD as PCP - Electrophysiology (Cardiology) Madelin Rear, [facility] as Pharmacist (Pharmacist)  Recent office visits: *** Recent consult visits: ***  Objective: Lab Results  Component Value Date   CREATININE 0.67 11/14/2019   BUN 7 11/14/2019   GFR 77.17 04/24/2019   GFRNONAA 84 11/14/2019   GFRAA 98 11/14/2019   NA 130 (L) 11/14/2019   K 5.2 11/14/2019   CALCIUM 9.8 11/14/2019   CO2 25 11/14/2019   Lab Results  Component Value Date/Time   GFR 77.17 04/24/2019 03:35 PM   GFR 89.45 06/23/2017 08:09 AM    Last diabetic Eye exam: No results found for: HMDIABEYEEXA  Last diabetic Foot exam: No results found for: HMDIABFOOTEX  Lab Results  Component Value Date   CHOL 183 04/24/2019   HDL 70.60 04/24/2019   LDLCALC 101 (H) 04/24/2019   LDLDIRECT 105.8 01/28/2010   TRIG 54.0 04/24/2019   CHOLHDL 3 04/24/2019   Hepatic Function Latest Ref Rng & Units 11/14/2019 04/24/2019 07/30/2018  Total Protein 6.1 - 8.1 g/dL 6.8 7.0 6.5  Albumin 3.5 - 5.2 g/dL - 4.5 4.1  AST 10 - 35 U/L '18 19 20  ' ALT 6 - 29 U/L '10 11 13  ' Alk Phosphatase 39 - 117 U/L - 52 51  Total Bilirubin 0.2 - 1.2 mg/dL 0.6 0.5 0.6  Bilirubin, Direct 0.0 - 0.3 mg/dL - - -   Lab Results  Component Value Date/Time   TSH 1.27 11/18/2019  08:19 AM   TSH 1.78 04/10/2017 12:37 PM   CBC Latest Ref Rng & Units 11/14/2019 04/24/2019 07/30/2018  WBC 3.8 - 10.8 Thousand/uL 4.7 5.6 4.3  Hemoglobin 11.7 - 15.5 g/dL 12.5 12.7 12.7  Hematocrit 35.0 - 45.0 % 37.3 36.7 38.1  Platelets 140 - 400 Thousand/uL 300 290.0 264   No results found for: VD25OH  Clinical ASCVD: {YES/NO:21197} The 10-year ASCVD risk score Mikey Bussing DC Jr., et al., 2013) is: 19.6%   Values used to calculate the score:     Age: 20 years     Sex: Female     Is Non-Hispanic African American: No     Diabetic: No     Tobacco smoker: No     Systolic Blood Pressure: 481 mmHg     Is BP treated: No     HDL Cholesterol: 70.6 mg/dL     Total Cholesterol: 183 mg/dL    Depression screen [facility] 2/9 04/24/2019 11/15/2016 10/30/2015  Decreased Interest 0 0 0  Down, Depressed, Hopeless 0 0 0  PHQ - 2 Score 0 0 0    Social History   Tobacco Use  Smoking Status Never Smoker  Smokeless Tobacco Never Used   BP Readings from Last 3 Encounters:  11/14/19 118/60  10/15/19 136/74  04/24/19 (!) 148/84   Pulse Readings from Last 3 Encounters:  11/14/19 (!) 51  10/15/19 60  04/24/19 62  Wt Readings from Last 3 Encounters:  11/14/19 138 lb 12.8 oz (63 kg)  10/15/19 140 lb (63.5 kg)  04/24/19 140 lb (63.5 kg)   Assessment/Interventions: Review of patient past medical history, allergies, medications, health status, including review of consultants reports, laboratory and other test data, was performed as part of comprehensive evaluation and provision of chronic care management services.   SDOH:  (Social Determinants of Health) assessments and interventions performed: {yes/no:20286}  CCM Care Plan Allergies  Allergen Reactions  . Alendronate Sodium     REACTION: GI problems fosamax  . Iodides Hives  . Risedronate Sodium     REACTION: GI problems boniva   Medications Reviewed Today    Reviewed by Jake Bathe, RN (Registered Nurse) on 02/25/20 at 1336  Med List Status:  <None>  Medication Order Taking? Sig Documenting Provider Last Dose Status Informant  Albuterol Sulfate (PROAIR RESPICLICK) 295 (90 Base) MCG/ACT AEPB [id number] No Inhale 2 puffs into the lungs every 6 (six) hours as needed (wheezing or shortness of breath with cat exposure).  Patient not taking: No sig reported   Marin Olp, MD Not Taking Active Self           Med Note Gar Ponto   Tue Oct 15, 2019  2:18 PM)    Calcium Carbonate-Vit D-Min (CALCIUM 1200 PO) [id number] Yes Take 1,200 Units by mouth 2 (two) times daily.  [provider] Taking Active Self  Cholecalciferol (VITAMIN D-3 PO) [id number] Yes Take 2,000 Units by mouth daily. [provider] Taking Active Self  Coenzyme Q10 (CO Q 10 PO) [id number] Yes Take 1 capsule by mouth.  [provider] Taking Active Self  Cyanocobalamin (VITAMIN B-12) 1000 MCG SUBL [id number] Yes Place 1,000 mcg under the tongue. [provider] Taking Active Self  hydroxypropyl methylcellulose / hypromellose (ISOPTO TEARS / GONIOVISC) 2.5 % ophthalmic solution [id number] Yes Place 1 drop into both eyes as needed for dry eyes. [provider] Taking Active Self  Ibuprofen-Diphenhydramine Cit (ADVIL PM PO) [id number] Yes Take by mouth. [provider] Taking Active Self  magnesium oxide (MAG-OX) 400 MG tablet [id number] Yes Take 400 mg by mouth daily. [provider] Taking Active   meclizine (ANTIVERT) 25 MG tablet [id number] No Take 1 tablet (25 mg total) by mouth 3 (three) times daily as needed for dizziness.  Patient not taking: No sig reported   Marin Olp, MD Not Taking Active   metoprolol tartrate (LOPRESSOR) 25 MG tablet [id number] Yes Take 1 tablet (25 mg total) by mouth 2 (two) times daily. Deboraha Sprang, MD Taking Active   ondansetron [facility] ODT) 4 MG disintegrating tablet [id number] No Take 1 tablet (4 mg total) by mouth every 8 (eight) hours as needed for nausea or vomiting.   Patient not taking: No sig reported   Marin Olp, MD Not Taking Active   QUERCETIN PO [id number] Yes Take 500 mg by mouth daily. [provider] Taking Active   TURMERIC PO [id number] Yes Take 1 Dose by mouth daily. [provider] Taking Active Self  zinc gluconate 50 MG tablet [id number] Yes Take 50 mg by mouth daily. [provider] Taking Active          Patient Active Problem List   Diagnosis Date Noted  . NSVT (nonsustained ventricular tachycardia) ([place]) 04/24/2019  . Dizzy spells 06/20/2017  . Advanced directives, counseling/discussion 10/30/2015  . Hip pain, bilateral 04/30/2015  . Hemorrhoids 09/30/2013  . Allergy to cats  09/30/2013  . Hyperlipidemia, unspecified 09/30/2013  . Family hx of colon cancer 04/30/2012  . GERD 02/09/2010  . INSOMNIA-SLEEP DISORDER-UNSPEC 02/09/2010  . Osteoporosis 10/17/2006   Immunization History  Administered Date(s) Administered  . Influenza Split 10/12/2010, 10/26/2011  . Influenza Whole 10/11/2006, 10/31/2007, 12/24/2008, 10/13/2009  . Influenza, High Dose Seasonal PF 10/29/2012, 10/02/2013, 10/30/2015, 09/27/2016, 10/09/2017, 09/03/2018, 09/20/2019  . Influenza,inj,Quad PF,6+ Mos 09/29/2014  . Moderna Sars-Covid-2 Vaccination 02/18/2019, 03/17/2019  . Pneumococcal Conjugate-13 12/23/2014  . Pneumococcal Polysaccharide-23 01/16/2007  . Td 01/16/2007  . Tdap 04/10/2017  . Zoster Recombinat (Shingrix) 08/24/2017, 11/21/2017    Conditions to be addressed/monitored:  {USCCMDZASSESSMENTOPTIONS:23563} There are no care plans that you recently modified to display for this patient.   Medication Assistance: {MEDASSISTANCEINFO:25044} Patient's preferred pharmacy is:  [facility] - [place], [place] - [address] [place] [address] Phone: [phone] Fax: [phone]  [id number] [place] [address] [place]: [phone]: [phone] Uses pill box? {Yes or If no, why not?:20788}. Pt endorses ***% compliance. We discussed: {Pharmacy options:24294}. Patient decided to: {US Pharmacy PYBOF:75102}Patient agrees to Care Plan and Follow-up***.  Current Barriers:  . {pharmacybarriers:24917}  Pharmacist Clinical Goal(s):  .Marland KitchenOver the next *** days, patient will {PHARMACYGOALCHOICES:24921} through collaboration with PharmD and provider.  . ***  Interventions: . 1:1 collaboration with HMarin Olp MD regarding development and update of comprehensive plan of care as evidenced by provider attestation and co-signature . Inter-disciplinary care team collaboration (see longitudinal plan of care) . Comprehensive medication review performed; medication list updated in electronic medical record  Hyperlipidemia: (LDL goal < 70) -Uncontrolled -Current treatment: . None - patient preference to remain off statin -Medications previously tried: ***  -Current dietary patterns: *** -Current exercise habits: *** -Educated on {CCM HLD Counseling:25126} -{CCMPHARMDINTERVENTION:25122}  Osteoporosis / Osteopenia (Goal ***) -{US controlled/uncontrolled:25276} -Last DEXA Scan: ***   T-Score femoral neck: ***  T-Score total hip: ***  T-Score lumbar spine: ***  T-Score forearm radius: ***  10-year probability of major osteoporotic fracture: ***  10-year probability of hip fracture: *** -Patient {is;is not an osteoporosis candidate:23886} -Current treatment  . *** -Medications previously tried: ***  -{Osteoporosis Counseling:23892} -{CCMPHARMDINTERVENTION:25122}  Patient Goals/Self-Care Activities . Over the next *** days, patient will:  - {pharmacypatientgoals:24919}  Future Appointments  Date Time Provider [place] 03/04/2020  2:00 PM [facility]adelin [facility]ear [facility] 03/10/2020  8:00 AM Mansouraty, GTelford Nab, MD [facility]  03/16/2020  9:05 AM  [facility] DEVICE REMOTES [facility]  04/20/2020  9:05 AM [facility] DEVICE REMOTES [facility]  05/25/2020  9:05 AM [facility] DEVICE REMOTES [facility]  11/17/2020 10:40 AM Hunter, SBrayton Mars MD [facility]-HPC P[facility]   Follow-up plan with Care Management Team: . CPA: *** . RPH: *** visit  JMadelin Rear Pharm.D., BCGP Clinical Pharmacist [phone]

## 2020-03-04 NOTE — Chronic Care Management (AMB) (Signed)
  Chronic Care Management   Outreach Note   Name: Ruth Wilson MRN: [id number] DOB: Jun 20, 1940  Referred by: Marin Olp, MD Reason for referral: Telephone Appointment with [place] Pharmacist, Madelin Rear.   An unsuccessful telephone outreach was attempted today. The patient was referred to the pharmacist for assistance with care management and care coordination.   At store at time of visit, requested call back to RS.    Telephone appointment with clinical pharmacist today (03/04/2020) at 2pm. If patient immediately returns call, transfer to [phone]. Otherwise, please provide this number so patient can reschedule visit.   Madelin Rear, Pharm.D., BCGP Clinical Pharmacist [facility] [phone]

## 2020-03-04 NOTE — Chronic Care Management (AMB) (Signed)
    Chronic Care Management Pharmacy Assistant   Name: Ruth Wilson  MRN: [id number] DOB: 1940/01/23  Reason for Encounter: Schedule Follow-Up With Clinical Pharmacist  Patient rescheduled her follow-up telephone appointment with Madelin Rear, CPP for Monday 03/16/2020 at 3:30 pm.  April Daiva Huge, [place] Pharmacist Assistant [phone]   Follow-Up:  Pharmacist Review

## 2020-03-05 ENCOUNTER — Other Ambulatory Visit: Payer: Self-pay | Admitting: Family Medicine

## 2020-03-05 DIAGNOSIS — Z1231 Encounter for screening mammogram for malignant neoplasm of breast: Secondary | ICD-10-CM

## 2020-03-09 ENCOUNTER — Telehealth: Payer: Self-pay

## 2020-03-09 NOTE — Chronic Care Management (AMB) (Signed)
    Chronic Care Management Pharmacy Assistant   Name: Ruth Wilson  MRN: [id number] DOB: [birth year]  Reason for Encounter: Chart Review  PCP : Marin Olp, MD  Allergies:   Allergies  Allergen Reactions  . Alendronate Sodium     REACTION: GI problems fosamax  . Iodides Hives  . Risedronate Sodium     REACTION: GI problems boniva    Medications: Outpatient Encounter Medications as of 03/09/2020  Medication Sig  . Albuterol Sulfate (PROAIR RESPICLICK) 101 (90 Base) MCG/ACT AEPB Inhale 2 puffs into the lungs every 6 (six) hours as needed (wheezing or shortness of breath with cat exposure). (Patient not taking: No sig reported)  . Calcium Carbonate-Vit D-Min (CALCIUM 1200 PO) Take 1,200 Units by mouth 2 (two) times daily.   . Cholecalciferol (VITAMIN D-3 PO) Take 2,000 Units by mouth daily.  . Coenzyme Q10 (CO Q 10 PO) Take 1 capsule by mouth.   . Cyanocobalamin (VITAMIN B-12) 1000 MCG SUBL Place 1,000 mcg under the tongue.  . hydroxypropyl methylcellulose / hypromellose (ISOPTO TEARS / GONIOVISC) 2.5 % ophthalmic solution Place 1 drop into both eyes as needed for dry eyes.  . Ibuprofen-Diphenhydramine Cit (ADVIL PM PO) Take by mouth.  . magnesium oxide (MAG-OX) 400 MG tablet Take 400 mg by mouth daily.  . meclizine (ANTIVERT) 25 MG tablet Take 1 tablet (25 mg total) by mouth 3 (three) times daily as needed for dizziness. (Patient not taking: No sig reported)  . metoprolol tartrate (LOPRESSOR) 25 MG tablet Take 1 tablet (25 mg total) by mouth 2 (two) times daily.  . ondansetron (ZOFRAN ODT) 4 MG disintegrating tablet Take 1 tablet (4 mg total) by mouth every 8 (eight) hours as needed for nausea or vomiting. (Patient not taking: No sig reported)  . PEG-KCl-NaCl-NaSulf-Na Asc-C (PLENVU) 140 g SOLR Take 1 kit by mouth as directed.  Marland Kitchen QUERCETIN PO Take 500 mg by mouth daily.  . TURMERIC PO Take 1 Dose by mouth daily.  Marland Kitchen zinc gluconate 50 MG tablet Take 50 mg by mouth daily.    No facility-administered encounter medications on file as of 03/09/2020.    Current Diagnosis: Patient Active Problem List   Diagnosis Date Noted  . NSVT (nonsustained ventricular tachycardia) ([place]) 04/24/2019  . Dizzy spells 06/20/2017  . Advanced directives, counseling/discussion 10/30/2015  . Hip pain, bilateral 04/30/2015  . Hemorrhoids 09/30/2013  . Allergy to cats 09/30/2013  . Hyperlipidemia, unspecified 09/30/2013  . Family hx of colon cancer 04/30/2012  . GERD 02/09/2010  . INSOMNIA-SLEEP DISORDER-UNSPEC 02/09/2010  . Osteoporosis 10/17/2006    Reviewed chart for medication changes.  No OVs, Consults, or hospital visits since last care coordination call/Pharmacist visit.   No medication changes indicated.  April D Calhoun, [place] Pharmacist Assistant [phone]   Follow-Up:  Pharmacist Review

## 2020-03-10 ENCOUNTER — Ambulatory Visit (AMBULATORY_SURGERY_CENTER): Payer: PPO | Admitting: Gastroenterology

## 2020-03-10 ENCOUNTER — Other Ambulatory Visit: Payer: Self-pay

## 2020-03-10 ENCOUNTER — Encounter: Payer: Self-pay | Admitting: Gastroenterology

## 2020-03-10 VITALS — BP 124/63 | HR 64 | Temp 96.9°F | Resp 15 | Ht 64.0 in | Wt 138.0 lb

## 2020-03-10 DIAGNOSIS — K621 Rectal polyp: Secondary | ICD-10-CM | POA: Diagnosis not present

## 2020-03-10 DIAGNOSIS — Z86010 Personal history of colonic polyps: Secondary | ICD-10-CM | POA: Diagnosis not present

## 2020-03-10 DIAGNOSIS — K6289 Other specified diseases of anus and rectum: Secondary | ICD-10-CM | POA: Diagnosis not present

## 2020-03-10 DIAGNOSIS — D128 Benign neoplasm of rectum: Secondary | ICD-10-CM

## 2020-03-10 DIAGNOSIS — Z1211 Encounter for screening for malignant neoplasm of colon: Secondary | ICD-10-CM | POA: Diagnosis not present

## 2020-03-10 DIAGNOSIS — Z800 Family history of malignant neoplasm of digestive organs: Secondary | ICD-10-CM | POA: Diagnosis not present

## 2020-03-10 MED ORDER — SODIUM CHLORIDE 0.9 % IV SOLN: 500.0000 mL | Freq: Once | INTRAVENOUS | Status: DC

## 2020-03-10 NOTE — Patient Instructions (Signed)
Handouts given for polyps, diverticulosis, hemorrhoids, high fiber diet and hemorrhoid banding.  YOU HAD AN ENDOSCOPIC PROCEDURE TODAY AT [facility]:   Refer to the procedure report that was given to you for any specific questions about what was found during the examination.  If the procedure report does not answer your questions, please call your gastroenterologist to clarify.  If you requested that your care partner not be given the details of your procedure findings, then the procedure report has been included in a sealed envelope for you to review at your convenience later.  YOU SHOULD EXPECT: Some feelings of bloating in the abdomen. Passage of more gas than usual.  Walking can help get rid of the air that was put into your GI tract during the procedure and reduce the bloating. If you had a lower endoscopy (such as a colonoscopy or flexible sigmoidoscopy) you may notice spotting of blood in your stool or on the toilet paper. If you underwent a bowel prep for your procedure, you may not have a normal bowel movement for a few days.  Please Note:  You might notice some irritation and congestion in your nose or some drainage.  This is from the oxygen used during your procedure.  There is no need for concern and it should clear up in a day or so.  SYMPTOMS TO REPORT IMMEDIATELY:   Following lower endoscopy (colonoscopy or flexible sigmoidoscopy):  Excessive amounts of blood in the stool  Significant tenderness or worsening of abdominal pains  Swelling of the abdomen that is new, acute  Fever of 100F or higher  For urgent or emergent issues, a gastroenterologist can be reached at any hour by calling [phone]. Do not use MyChart messaging for urgent concerns.    DIET:  We do recommend a small meal at first, but then you may proceed to your regular diet.  Drink plenty of fluids but you should avoid alcoholic beverages for 24 hours.  ACTIVITY:  You should plan to take it  easy for the rest of today and you should NOT DRIVE or use heavy machinery until tomorrow (because of the sedation medicines used during the test).    FOLLOW UP: Our staff will call the number listed on your records 48-72 hours following your procedure to check on you and address any questions or concerns that you may have regarding the information given to you following your procedure. If we do not reach you, we will leave a message.  We will attempt to reach you two times.  During this call, we will ask if you have developed any symptoms of COVID 19. If you develop any symptoms (ie: fever, flu-like symptoms, shortness of breath, cough etc.) before then, please call [phone].  If you test positive for Covid 19 in the 2 weeks post procedure, please call and report this information to [place].    If any biopsies were taken you will be contacted by phone or by letter within the next 1-3 weeks.  Please call us at [phone] if you have not heard about the biopsies in 3 weeks.    SIGNATURES/CONFIDENTIALITY: You and/or your care partner have signed paperwork which will be entered into your electronic medical record.  These signatures attest to the fact that that the information above on your After Visit Summary has been reviewed and is understood.  Full responsibility of the confidentiality of this discharge information lies with you and/or your care-partner.

## 2020-03-10 NOTE — Progress Notes (Signed)
Called to room to assist during endoscopic procedure.  Patient ID and intended procedure confirmed with present staff. Received instructions for my participation in the procedure from the performing physician.  

## 2020-03-10 NOTE — Op Note (Signed)
[place] Patient Name: Ruth Wilson Procedure Date: 03/10/2020 7:58 AM MRN: [id number] Endoscopist: Justice Britain , MD Age: 80 Referring MD:  Date of Birth: [birth year] Gender: Female Account #: [id number] Procedure:                Colonoscopy Indications:              Screening in patient at increased risk: Family                            history of 1st-degree relative with colorectal                            cancer, Incidental - Diverticula Medicines:                Monitored Anesthesia Care Procedure:                Pre-Anesthesia Assessment:                           - Prior to the procedure, a History and Physical                            was performed, and patient medications and                            allergies were reviewed. The patient's tolerance of                            previous anesthesia was also reviewed. The risks                            and benefits of the procedure and the sedation                            options and risks were discussed with the patient.                            All questions were answered, and informed consent                            was obtained. Prior Anticoagulants: The patient has                            taken no previous anticoagulant or antiplatelet                            agents except for NSAID medication. ASA Grade                            Assessment: II - A patient with mild systemic                            disease. After reviewing the risks and benefits,  the patient was deemed in satisfactory condition to                            undergo the procedure.                           After obtaining informed consent, the colonoscope                            was passed under direct vision. Throughout the                            procedure, the patient's blood pressure, pulse, and                            oxygen saturations were monitored continuously. The                             Olympus PFC-[id number] (#[id number]) Colonoscope was                            introduced through the anus and advanced to the 5                            cm into the ileum. The colonoscopy was somewhat                            difficult due to a tortuous colon. Successful                            completion of the procedure was aided by using                            manual pressure, withdrawing and reinserting the                            scope, straightening and shortening the scope to                            obtain bowel loop reduction and using scope                            torsion. The patient tolerated the procedure. The                            quality of the bowel preparation was good. The                            terminal ileum, ileocecal valve, appendiceal                            orifice, and rectum were photographed. Scope In: 8:02:30 AM Scope Out: 8:20:04 AM Scope Withdrawal Time: 0 hours 9 minutes 54 seconds  Total Procedure Duration: 0  hours 17 minutes 34 seconds  Findings:                 The digital rectal exam findings include                            hemorrhoids. Pertinent negatives include no                            palpable rectal lesions.                           Skin tags were found on perianal exam.                           The terminal ileum and ileocecal valve appeared                            normal.                           Multiple small-mouthed diverticula were found in                            the recto-sigmoid colon, sigmoid colon and                            descending colon.                           A 4 mm polyp was found in the rectum. The polyp was                            sessile. The polyp was removed with a cold snare.                            Resection and retrieval were complete.                           Normal mucosa was found in the entire colon                             otherwise.                           Non-bleeding non-thrombosed external and internal                            hemorrhoids were found during retroflexion, during                            perianal exam and during digital exam. The                            hemorrhoids were Grade II (internal hemorrhoids  that prolapse but reduce spontaneously). Complications:            No immediate complications. Estimated Blood Loss:     Estimated blood loss was minimal. Impression:               - Hemorrhoids found on digital rectal exam.                           - Perianal skin tags found on perianal exam.                           - The examined portion of the ileum was normal.                           - Diverticulosis in the recto-sigmoid colon, in the                            sigmoid colon and in the descending colon.                           - One 4 mm polyp in the rectum, removed with a cold                            snare. Resected and retrieved.                           - Normal mucosa in the entire examined colon                            otherwise.                           - Non-bleeding non-thrombosed external and internal                            hemorrhoids. Recommendation:           - The patient will be observed post-procedure,                            until all discharge criteria are met.                           - Discharge patient to home.                           - Patient has a contact number available for                            emergencies. The signs and symptoms of potential                            delayed complications were discussed with the                            patient. Return to normal activities tomorrow.  Written discharge instructions were provided to the                            patient.                           - High fiber diet.                           - Use FiberCon 1-2 tablets PO  daily.                           - Continue present medications.                           - Await pathology results.                           - Repeat colonoscopy in 5 years for                            surveillance/screening would be recommended due to                            family history, however, patient will be in her                            young 60s, so unless her health remains in                            excellent condition this could be her last                            colonoscopy. Would be happy to evaluate in clinic                            and discuss with PCP at time of follow up if she                            remains a candidate for colonoscopy at that time.                           - The findings and recommendations were discussed                            with the patient.                           - The findings and recommendations were discussed                            with the patient's family. Justice Britain, MD 03/10/2020 8:26:59 AM

## 2020-03-10 NOTE — Progress Notes (Signed)
Pt's states no medical or surgical changes since previsit or office visit.  BC - vitals 

## 2020-03-10 NOTE — Progress Notes (Signed)
PT taken to PACU. Monitors in place. VSS. Report given to RN. 

## 2020-03-12 ENCOUNTER — Telehealth: Payer: Self-pay

## 2020-03-12 NOTE — Telephone Encounter (Signed)
  Follow up Call-  Call back number 03/10/2020  Post procedure Call Back phone  # [phone]  Permission to leave phone message Yes  Some recent data might be hidden     Patient questions:  Do you have a fever, pain , or abdominal swelling? No. Pain Score  0 *  Have you tolerated food without any problems? Yes.    Have you been able to return to your normal activities? Yes.    Do you have any questions about your discharge instructions: Diet   No. Medications  No. Follow up visit  No.  Do you have questions or concerns about your Care? No.  Actions: * If pain score is 4 or above: No action needed, pain <4.  1. Have you developed a fever since your procedure? no  2.   Have you had an respiratory symptoms (SOB or cough) since your procedure? no  3.   Have you tested positive for COVID 19 since your procedure no  4.   Have you had any family members/close contacts diagnosed with the COVID 19 since your procedure?  no   If yes to any of these questions please route to Joylene John, RN and Joella Prince, RN

## 2020-03-16 ENCOUNTER — Ambulatory Visit (INDEPENDENT_AMBULATORY_CARE_PROVIDER_SITE_OTHER): Payer: PPO

## 2020-03-16 ENCOUNTER — Other Ambulatory Visit: Payer: Self-pay

## 2020-03-16 DIAGNOSIS — E785 Hyperlipidemia, unspecified: Secondary | ICD-10-CM

## 2020-03-16 DIAGNOSIS — M810 Age-related osteoporosis without current pathological fracture: Secondary | ICD-10-CM

## 2020-03-16 DIAGNOSIS — R55 Syncope and collapse: Secondary | ICD-10-CM

## 2020-03-16 NOTE — Progress Notes (Signed)
Chronic Care Management Pharmacy Note  03/16/2020 Name:  Ruth Wilson MRN:  [id number] DOB:  1940-06-07  Subjective: Ruth Wilson is an 80 y.o. year old female who is a primary patient of Hunter, Brayton Mars, MD.  The CCM team was consulted for assistance with disease management and care coordination needs.    Engaged with patient by telephone for follow up visit in response to provider referral for pharmacy case management and/or care coordination services.   Consent to Services:  The patient was given information about Chronic Care Management services, agreed to services, and gave verbal consent prior to initiation of services.  Please see initial visit note for detailed documentation.   Patient Care Team: Marin Olp, MD as PCP - General (Family Medicine) Lelon Perla, MD as PCP - Cardiology (Cardiology) Deboraha Sprang, MD as PCP - Electrophysiology (Cardiology) Madelin Rear, [facility] as Pharmacist (Pharmacist)  Hospital visits: None in previous 6 months  Objective: Lab Results  Component Value Date   CREATININE 0.67 11/14/2019   CREATININE 0.73 04/24/2019   CREATININE 0.86 07/30/2018   No results found for: HGBA1C Last diabetic Eye exam: No results found for: HMDIABEYEEXA  Last diabetic Foot exam: No results found for: HMDIABFOOTEX     Component Value Date/Time   CHOL 183 04/24/2019 1535   TRIG 54.0 04/24/2019 1535   HDL 70.60 04/24/2019 1535   CHOLHDL 3 04/24/2019 1535   VLDL 10.8 04/24/2019 1535   LDLCALC 101 (H) 04/24/2019 1535   LDLDIRECT 105.8 01/28/2010 0840   Hepatic Function Latest Ref Rng & Units 11/14/2019 04/24/2019 07/30/2018  Total Protein 6.1 - 8.1 g/dL 6.8 7.0 6.5  Albumin 3.5 - 5.2 g/dL - 4.5 4.1  AST 10 - 35 U/L '18 19 20  ' ALT 6 - 29 U/L '10 11 13  ' Alk Phosphatase 39 - 117 U/L - 52 51  Total Bilirubin 0.2 - 1.2 mg/dL 0.6 0.5 0.6  Bilirubin, Direct 0.0 - 0.3 mg/dL - - -   Lab Results  Component Value Date/Time   TSH 1.27 11/18/2019  08:19 AM   TSH 1.78 04/10/2017 12:37 PM   CBC Latest Ref Rng & Units 11/14/2019 04/24/2019 07/30/2018  WBC 3.8 - 10.8 Thousand/uL 4.7 5.6 4.3  Hemoglobin 11.7 - 15.5 g/dL 12.5 12.7 12.7  Hematocrit 35.0 - 45.0 % 37.3 36.7 38.1  Platelets 140 - 400 Thousand/uL 300 290.0 264   No results found for: VD25OH  Clinical ASCVD: No  The 10-year ASCVD risk score Mikey Bussing DC Jr., et al., 2013) is: 21.3%   Values used to calculate the score:     Age: 10 years     Sex: Female     Is Non-Hispanic African American: No     Diabetic: No     Tobacco smoker: No     Systolic Blood Pressure: 631 mmHg     Is BP treated: No     HDL Cholesterol: 70.6 mg/dL     Total Cholesterol: 183 mg/dL    08/2019 DEXA   Lumbar spine L1-L4 Femoral neck (FN) Total Hip  T-score   -1.1 RFN: -2.4 LFN: -2.2 RTH: -3.2 LTH: -2.6  Change in BMD from previous DXA test (%) Down 2.0%  Up 0.7 %      Social History   Tobacco Use  Smoking Status Never Smoker  Smokeless Tobacco Never Used   BP Readings from Last 3 Encounters:  03/10/20 124/63  11/14/19 118/60  10/15/19 136/74   Pulse Readings from Last  3 Encounters:  03/10/20 64  11/14/19 (!) 51  10/15/19 60   Wt Readings from Last 3 Encounters:  03/10/20 138 lb (62.6 kg)  11/14/19 138 lb 12.8 oz (63 kg)  10/15/19 140 lb (63.5 kg)   Assessment: Review of patient past medical history, allergies, medications, health status, including review of consultants reports, laboratory and other test data, was performed as part of comprehensive evaluation and provision of chronic care management services.   SDOH:  (Social Determinants of Health) assessments and interventions performed:   CCM Care Plan Allergies  Allergen Reactions  . Alendronate Sodium     REACTION: GI problems fosamax  . Iodides Hives  . Risedronate Sodium     REACTION: GI problems boniva   Medications Reviewed Today    Reviewed by Madelin Rear, [facility] (Pharmacist) on 03/16/20 at [place] List Status:  <None>  Medication Order Taking? Sig Documenting Provider Last Dose Status Informant  Albuterol Sulfate (PROAIR RESPICLICK) 010 (90 Base) MCG/ACT AEPB [id number] Yes Inhale 2 puffs into the lungs every 6 (six) hours as needed (wheezing or shortness of breath with cat exposure). Marin Olp, MD Taking Active            Med Note Gar Ponto   Tue Oct 15, 2019  2:18 PM)    Calcium Carbonate-Vit D-Min (CALCIUM 1200 PO) [id number] Yes Take 1,200 Units by mouth 2 (two) times daily.  [provider] Taking Active Self  Cholecalciferol (VITAMIN D-3 PO) [id number] Yes Take 2,000 Units by mouth daily. [provider] Taking Active Self  Coenzyme Q10 (CO Q 10 PO) [id number] Yes Take 1 capsule by mouth.  [provider] Taking Active Self  Cyanocobalamin (VITAMIN B-12) 1000 MCG SUBL [id number] Yes Place 1,000 mcg under the tongue. [provider] Taking Active Self  hydroxypropyl methylcellulose / hypromellose (ISOPTO TEARS / GONIOVISC) 2.5 % ophthalmic solution [id number] Yes Place 1 drop into both eyes as needed for dry eyes. [provider] Taking Active Self  Ibuprofen-Diphenhydramine Cit (ADVIL PM PO) [id number] Yes Take by mouth. [provider] Taking Active Self  magnesium oxide (MAG-OX) 400 MG tablet [id number] Yes Take 400 mg by mouth daily. [provider] Taking Active   meclizine (ANTIVERT) 25 MG tablet [id number] Yes Take 1 tablet (25 mg total) by mouth 3 (three) times daily as needed for dizziness. Marin Olp, MD Taking Active   metoprolol tartrate (LOPRESSOR) 25 MG tablet [id number] Yes Take 1 tablet (25 mg total) by mouth 2 (two) times daily. Deboraha Sprang, MD Taking Active   ondansetron [facility] ODT) 4 MG disintegrating tablet [id number] Yes Take 1 tablet (4 mg total) by mouth every 8 (eight) hours as needed for nausea or vomiting. Marin Olp, MD Taking Active   QUERCETIN PO [id number] Yes Take 500 mg by mouth daily.  [provider] Taking Active   TURMERIC PO [id number] Yes Take 1 Dose by mouth daily. [provider] Taking Active Self  zinc gluconate 50 MG tablet [id number] Yes Take 50 mg by mouth daily. [provider] Taking Active          Patient Active Problem List   Diagnosis Date Noted  . NSVT (nonsustained ventricular tachycardia) ([place]) 04/24/2019  . Dizzy spells 06/20/2017  . Advanced directives, counseling/discussion 10/30/2015  . Hip pain, bilateral 04/30/2015  . Hemorrhoids 09/30/2013  . Allergy to cats 09/30/2013  . Hyperlipidemia, unspecified 09/30/2013  . Family hx of colon cancer 04/30/2012  . GERD 02/09/2010  .  INSOMNIA-SLEEP DISORDER-UNSPEC 02/09/2010  . Osteoporosis 10/17/2006   Immunization History  Administered Date(s) Administered  . Influenza Split 10/12/2010, 10/26/2011  . Influenza Whole 10/11/2006, 10/31/2007, 12/24/2008, 10/13/2009  . Influenza, High Dose Seasonal PF 10/29/2012, 10/02/2013, 10/30/2015, 09/27/2016, 10/09/2017, 09/03/2018, 09/20/2019  . Influenza,inj,Quad PF,6+ Mos 09/29/2014  . Moderna Sars-Covid-2 Vaccination 02/18/2019, 03/17/2019  . Pneumococcal Conjugate-13 12/23/2014  . Pneumococcal Polysaccharide-23 01/16/2007  . Td 01/16/2007  . Tdap 04/10/2017  . Zoster Recombinat (Shingrix) 08/24/2017, 11/21/2017    Conditions to be addressed/monitored: HLD and Osteoporosis  Care Plan : [place]  Updates made by Madelin Rear, [facility] since 03/16/2020 12:00 AM    Problem: HLD Osteoporosis PVCs     Long-Range Goal: Disease Management   Start Date: 03/16/2020  Expected End Date: 03/16/2021  This Visit's Progress: On track  Priority: High  Note:   Hyperlipidemia: (LDL goal < 100) -Not ideally controlled -exercises >150 minutes per week, very conscious of diet - focuses on white meat, lots of fruits/vegetables, minimal processed foods - ASCVD 10-yr risk >20% - statin therapy has been declined at this time  -Current  treatment: . n/a -Current dietary patterns: fish/vegetables -Current exercise habits: gym 5x/week -Educated on Cholesterol goals;  Benefits of statin for ASCVD risk reduction; Importance of limiting foods high in cholesterol; -Counseled on diet and exercise extensively  Osteoporosis (Goal: ensure safe medication use) -Not ideally controlled -Last DEXA Scan: 2021-osteoporotic (was considering prolia/endocrine f/u-next PCP appt11/2022   -Patient is a candidate for pharmacologic treatment -Current treatment  . Calcium/vitamin d3 supplementation  -Medications previously tried: GI issues with oral bisphosphonates  -Recommend [id number] units of vitamin D daily. Recommend 1200 mg of calcium daily from dietary and supplemental sources. Recommend weight-bearing and muscle strengthening exercises for building and maintaining bone density. -Counseled on Prolia - will do repeat DEXA in 2 years otherwise declining prolia and endocrine referral for the time being    Current Barriers:  . Suboptimal therapeutic regimen for HLD  Pharmacist Clinical Goal(s):  Marland Kitchen Over the next 365 days, patient will achieve adherence to monitoring guidelines and medication adherence to achieve therapeutic efficacy . contact provider office for questions/concerns as evidenced notation of same in electronic health record through collaboration with PharmD and provider.   Interventions: . 1:1 collaboration with Marin Olp, MD regarding development and update of comprehensive plan of care as evidenced by provider attestation and co-signature . Inter-disciplinary care team collaboration (see longitudinal plan of care) . Comprehensive medication review performed; medication list updated in electronic medical record  Patient Goals/Self-Care Activities . Over the next 365 days, patient will:  - target a minimum of 150 minutes of moderate intensity exercise weekly  Follow Up Plan: 3 month f/u telephone  visit  Medication Assistance: [place] through [facility].  Patient's preferred pharmacy is:  [facility] - [place], [place] - [address] [place] [address] Phone: [phone] Fax: [phone]  [id number] [place] [address]: [phone]: [phone] Uses pill box? Yes Pt endorses 100% compliance  Follow Up:  Patient agrees to Care Plan and Follow-up.  Future Appointments  Date Time Provider [place] 04/20/2020  9:05 AM [facility] DEVICE REMOTES [facility]  04/24/2020 12:40 PM [facility] MM 2 [facility]  05/25/2020  9:05 AM [facility] DEVICE REMOTES [facility]  06/17/2020  3:30 PM [facility] CCM PHARMACIST [facility]  11/17/2020  10:40 AM Marin Olp, MD [facility]    Madelin Rear, Pharm.D., BCGP Clinical Pharmacist [place] [phone]

## 2020-03-16 NOTE — Patient Instructions (Signed)
Ms. Doane,  Thank you for taking the time to review your medications with me today.  I have included our care plan/goals in the following pages. Please review and call me at [phone] with any questions!  Thanks! Ellin Mayhew, Pharm.D., BCGP Clinical Pharmacist [facility] [place] [phone] Patient Care Plan: [place] Plan    Problem Identified: HLD Osteoporosis PVCs     Long-Range Goal: Disease Management   Start Date: 03/16/2020  Expected End Date: 03/16/2021  This Visit's Progress: On track  Priority: High  Note:   Hyperlipidemia: (LDL goal < 100) -Not ideally controlled -exercises >150 minutes per week, very conscious of diet - focuses on white meat, lots of fruits/vegetables, minimal processed foods - ASCVD 10-yr risk >20% - statin therapy has been declined at this time  -Current treatment: . n/a -Current dietary patterns: fish/vegetables -Current exercise habits: gym 5x/week -Educated on Cholesterol goals;  Benefits of statin for ASCVD risk reduction; Importance of limiting foods high in cholesterol; -Counseled on diet and exercise extensively  Osteoporosis (Goal: ensure safe medication use) -Not ideally controlled -Last DEXA Scan: 2021-osteoporotic (was considering prolia/endocrine f/u-next PCP appt11/2022   -Patient is a candidate for pharmacologic treatment due to T-Score -1.0 to -2.5 and 10-year risk of hip fracture > 3% -Current treatment  . Calcium/vitamin d3 supplementation  -Medications previously tried: GI issues with oral bisphosphonates  -Recommend [phone] units of vitamin D daily. Recommend 1200 mg of calcium daily from dietary and supplemental sources. Recommend weight-bearing and muscle strengthening exercises for building and maintaining bone density. -Counseled on Prolia - will do repeat DEXA in 2 years otherwise declining prolia and endocrine referral for the time being      The  patient verbalized understanding of instructions provided today and agreed to receive a MyChart copy of patient instruction and/or educational materials. Telephone follow up appointment with pharmacy team member scheduled for: See next appointment with "Care Management Staff" under "What's Next" below.

## 2020-03-18 LAB — CUP PACEART REMOTE DEVICE CHECK
Date Time Interrogation Session: 20220228233452
Implantable Pulse Generator Implant Date: 20200813

## 2020-03-24 ENCOUNTER — Encounter: Payer: Self-pay | Admitting: Gastroenterology

## 2020-03-24 NOTE — Progress Notes (Signed)
Carelink Summary Report / Loop Recorder ° °

## 2020-03-31 ENCOUNTER — Telehealth: Payer: Self-pay

## 2020-03-31 NOTE — Progress Notes (Signed)
    Chronic Care Management Pharmacy Assistant   Name: Ruth Wilson  MRN: [id number] DOB: 1940/11/15  Reason for Encounter: Medication Coordination    Medications: Outpatient Encounter Medications as of 03/31/2020  Medication Sig  . Albuterol Sulfate (PROAIR RESPICLICK) 381 (90 Base) MCG/ACT AEPB Inhale 2 puffs into the lungs every 6 (six) hours as needed (wheezing or shortness of breath with cat exposure).  . Calcium Carbonate-Vit D-Min (CALCIUM 1200 PO) Take 1,200 Units by mouth 2 (two) times daily.   . Cholecalciferol (VITAMIN D-3 PO) Take 2,000 Units by mouth daily.  . Coenzyme Q10 (CO Q 10 PO) Take 1 capsule by mouth.   . Cyanocobalamin (VITAMIN B-12) 1000 MCG SUBL Place 1,000 mcg under the tongue.  . hydroxypropyl methylcellulose / hypromellose (ISOPTO TEARS / GONIOVISC) 2.5 % ophthalmic solution Place 1 drop into both eyes as needed for dry eyes.  . Ibuprofen-Diphenhydramine Cit (ADVIL PM PO) Take by mouth.  . magnesium oxide (MAG-OX) 400 MG tablet Take 400 mg by mouth daily.  . meclizine (ANTIVERT) 25 MG tablet Take 1 tablet (25 mg total) by mouth 3 (three) times daily as needed for dizziness.  . metoprolol tartrate (LOPRESSOR) 25 MG tablet Take 1 tablet (25 mg total) by mouth 2 (two) times daily.  . ondansetron (ZOFRAN ODT) 4 MG disintegrating tablet Take 1 tablet (4 mg total) by mouth every 8 (eight) hours as needed for nausea or vomiting.  Marland Kitchen QUERCETIN PO Take 500 mg by mouth daily.  . TURMERIC PO Take 1 Dose by mouth daily.  Marland Kitchen zinc gluconate 50 MG tablet Take 50 mg by mouth daily.   No facility-administered encounter medications on file as of 03/31/2020.   Reviewed chart for medication changes ahead of medication coordination call.  No OVs, Consults, or hospital visits since last care coordination call/Pharmacist visit. (If appropriate, list visit date, provider name)  No medication changes indicated OR if recent visit, treatment plan here.  BP Readings from Last 3  Encounters:  03/10/20 124/63  11/14/19 118/60  10/15/19 136/74    No results found for: HGBA1C   Patient obtains medications through Vials  90 Days   Last adherence delivery included:  Metoprol Tar 25 mg Take one tab by mouth twice daily   Called patient and reviewed medications. Patient is not needing any medications at this time.   Georgiana Shore ,[place] Pharmacist Assistant [phone]

## 2020-04-18 LAB — CUP PACEART REMOTE DEVICE CHECK
Date Time Interrogation Session: 20220403012951
Implantable Pulse Generator Implant Date: 20200813

## 2020-04-20 ENCOUNTER — Ambulatory Visit (INDEPENDENT_AMBULATORY_CARE_PROVIDER_SITE_OTHER): Payer: PPO

## 2020-04-20 DIAGNOSIS — R55 Syncope and collapse: Secondary | ICD-10-CM

## 2020-04-24 ENCOUNTER — Ambulatory Visit
Admission: RE | Admit: 2020-04-24 | Discharge: 2020-04-24 | Disposition: A | Discharge status: Home / Self Care | Payer: PPO | Source: Ambulatory Visit | Attending: Family Medicine | Admitting: Family Medicine

## 2020-04-24 ENCOUNTER — Other Ambulatory Visit: Payer: Self-pay

## 2020-04-24 DIAGNOSIS — Z1231 Encounter for screening mammogram for malignant neoplasm of breast: Secondary | ICD-10-CM

## 2020-04-29 ENCOUNTER — Telehealth: Payer: Self-pay

## 2020-04-29 NOTE — Chronic Care Management (AMB) (Addendum)
    Chronic Care Management Pharmacy Assistant   Name: Ruth Wilson  MRN: [id number] DOB: [birth year]  Reason for Encounter: Medication Refill Coordination    Recent office visits:  No visits noted  Recent consult visits:  No visits noted  Hospital visits:  None in previous 6 months  Medications: Outpatient Encounter Medications as of 04/29/2020  Medication Sig   Albuterol Sulfate (PROAIR RESPICLICK) 657 (90 Base) MCG/ACT AEPB Inhale 2 puffs into the lungs every 6 (six) hours as needed (wheezing or shortness of breath with cat exposure).   Calcium Carbonate-Vit D-Min (CALCIUM 1200 PO) Take 1,200 Units by mouth 2 (two) times daily.    Cholecalciferol (VITAMIN D-3 PO) Take 2,000 Units by mouth daily.   Coenzyme Q10 (CO Q 10 PO) Take 1 capsule by mouth.    Cyanocobalamin (VITAMIN B-12) 1000 MCG SUBL Place 1,000 mcg under the tongue.   hydroxypropyl methylcellulose / hypromellose (ISOPTO TEARS / GONIOVISC) 2.5 % ophthalmic solution Place 1 drop into both eyes as needed for dry eyes.   Ibuprofen-Diphenhydramine Cit (ADVIL PM PO) Take by mouth.   magnesium oxide (MAG-OX) 400 MG tablet Take 400 mg by mouth daily.   meclizine (ANTIVERT) 25 MG tablet Take 1 tablet (25 mg total) by mouth 3 (three) times daily as needed for dizziness.   metoprolol tartrate (LOPRESSOR) 25 MG tablet Take 1 tablet (25 mg total) by mouth 2 (two) times daily.   ondansetron (ZOFRAN ODT) 4 MG disintegrating tablet Take 1 tablet (4 mg total) by mouth every 8 (eight) hours as needed for nausea or vomiting.   QUERCETIN PO Take 500 mg by mouth daily.   TURMERIC PO Take 1 Dose by mouth daily.   zinc gluconate 50 MG tablet Take 50 mg by mouth daily.   No facility-administered encounter medications on file as of 04/29/2020.   Reviewed chart for medication changes ahead of medication coordination call.  No OVs, Consults, or hospital visits since last care coordination call/Pharmacist visit. (If appropriate, list visit  date, provider name)  No medication changes indicated OR if recent visit, treatment plan here.  BP Readings from Last 3 Encounters:  03/10/20 124/63  11/14/19 118/60  10/15/19 136/74    No results found for: HGBA1C   Patient obtains medications through Vials  90 Days   Last adherence delivery included:  Metoprol Tar 25 mg Take one tab by mouth twice daily   Called patient and reviewed medications and confirmed sufficient supply of all medications, no delivery needed.  Wilford Sports CPA, CMA [facility]  refill reviewed completed - 5 minutes

## 2020-05-04 NOTE — Progress Notes (Signed)
Carelink Summary Report / Loop Recorder 

## 2020-05-25 ENCOUNTER — Ambulatory Visit (INDEPENDENT_AMBULATORY_CARE_PROVIDER_SITE_OTHER): Payer: PPO

## 2020-05-25 DIAGNOSIS — R55 Syncope and collapse: Secondary | ICD-10-CM

## 2020-05-26 LAB — CUP PACEART REMOTE DEVICE CHECK
Date Time Interrogation Session: 20220514230700
Implantable Pulse Generator Implant Date: 20200813

## 2020-06-10 ENCOUNTER — Encounter: Payer: Self-pay | Admitting: Family Medicine

## 2020-06-11 ENCOUNTER — Telehealth (INDEPENDENT_AMBULATORY_CARE_PROVIDER_SITE_OTHER): Payer: PPO | Admitting: Family Medicine

## 2020-06-11 DIAGNOSIS — R110 Nausea: Secondary | ICD-10-CM

## 2020-06-11 DIAGNOSIS — R197 Diarrhea, unspecified: Secondary | ICD-10-CM

## 2020-06-11 NOTE — Patient Instructions (Signed)
-  no dairy or red meat for 1 week  -could try an over the counter antacid medication per insructions  -make sure to stay hydrated  -check a covid test and schedule a follow up video visit with your primary care office if the covid test is positive and you wish to pursue treatment. Treatment is best given in the first 5-7 days of symptoms.  I hope you are feeling better soon!  Seek in person care promptly if your symptoms worsen, new concerns arise or you are not improving with treatment.  It was nice to meet you today. I help [facility] out with telemedicine visits on Tuesdays and Thursdays and am available for visits on those days. If you have any concerns or questions following this visit please schedule a follow up visit with your Primary Care doctor or seek care at a local urgent care clinic to avoid delays in care.

## 2020-06-11 NOTE — Progress Notes (Signed)
Virtual Visit via Video Note  I connected with Mackinzie  on 06/11/20 at 12:00 PM EDT by a video enabled telemedicine application and verified that I am speaking with the correct person using two identifiers.  Location patient: home, [place] Location provider:work or home office Persons participating in the virtual visit: patient, provider, patient's husband  I discussed the limitations of evaluation and management by telemedicine and the availability of in person appointments. The patient expressed understanding and agreed to proceed.   HPI:  Acute telemedicine visit for : -Onset: 3 days ago -Symptoms include: some stomach discomfort the first day - now resolved, some nausea on and off, feels tired, mild diarrhea initially -Denies: fevers, body aches, cough, dysuria, CP, SOB, blood in the stools, melena, inability to eat/drink/get out of bed -denies sick exposures, new foods or travel or abx -Pertinent past medical history: GERD, NSVT -Pertinent medication allergies:  Allergies  Allergen Reactions  . Alendronate Sodium     REACTION: GI problems fosamax  . Iodides Hives  . Risedronate Sodium     REACTION: GI problems boniva  -COVID-19 vaccine status: vaccinated x2 -has a hx of easily upset stomach  ROS: See pertinent positives and negatives per HPI.  Past Medical History:  Diagnosis Date  . Allergy    cat dander and seasonal  . Cataract   . Diverticulosis of colon without hemorrhage 04/30/2012  . Hypertension   . Osteopenia   . Osteoporosis     Past Surgical History:  Procedure Laterality Date  . cataract surgery     tattoo on eye due to damaged iris  . COLONOSCOPY    . OOPHORECTOMY     bilateral  . POLYPECTOMY    . TONSILLECTOMY    . TOTAL ABDOMINAL HYSTERECTOMY       Current Outpatient Medications:  .  Albuterol Sulfate (PROAIR RESPICLICK) 505 (90 Base) MCG/ACT AEPB, Inhale 2 puffs into the lungs every 6 (six) hours as needed (wheezing or shortness of breath with cat  exposure)., Disp: 1 each, Rfl: 1 .  Calcium Carbonate-Vit D-Min (CALCIUM 1200 PO), Take 1,200 Units by mouth 2 (two) times daily. , Disp: , Rfl:  .  Cholecalciferol (VITAMIN D-3 PO), Take 2,000 Units by mouth daily., Disp: , Rfl:  .  Coenzyme Q10 (CO Q 10 PO), Take 1 capsule by mouth. , Disp: , Rfl:  .  Cyanocobalamin (VITAMIN B-12) 1000 MCG SUBL, Place 1,000 mcg under the tongue., Disp: , Rfl:  .  hydroxypropyl methylcellulose / hypromellose (ISOPTO TEARS / GONIOVISC) 2.5 % ophthalmic solution, Place 1 drop into both eyes as needed for dry eyes., Disp: , Rfl:  .  Ibuprofen-Diphenhydramine Cit (ADVIL PM PO), Take by mouth., Disp: , Rfl:  .  magnesium oxide (MAG-OX) 400 MG tablet, Take 400 mg by mouth daily., Disp: , Rfl:  .  meclizine (ANTIVERT) 25 MG tablet, Take 1 tablet (25 mg total) by mouth 3 (three) times daily as needed for dizziness., Disp: 20 tablet, Rfl: 0 .  metoprolol tartrate (LOPRESSOR) 25 MG tablet, Take 1 tablet (25 mg total) by mouth 2 (two) times daily., Disp: 180 tablet, Rfl: 3 .  ondansetron (ZOFRAN ODT) 4 MG disintegrating tablet, Take 1 tablet (4 mg total) by mouth every 8 (eight) hours as needed for nausea or vomiting., Disp: 20 tablet, Rfl: 0 .  QUERCETIN PO, Take 500 mg by mouth daily., Disp: , Rfl:  .  TURMERIC PO, Take 1 Dose by mouth daily., Disp: , Rfl:  .  zinc  gluconate 50 MG tablet, Take 50 mg by mouth daily., Disp: , Rfl:   EXAM:  VITALS per patient if applicable:  GENERAL: alert, oriented, appears well and in no acute distress  HEENT: atraumatic, conjunttiva clear, no obvious abnormalities on inspection of external nose and ears  NECK: normal movements of the head and neck  LUNGS: on inspection no signs of respiratory distress, breathing rate appears normal, no obvious gross SOB, gasping or wheezing  CV: no obvious cyanosis  MS: moves all visible extremities without noticeable abnormality  PSYCH/NEURO: pleasant and cooperative, no obvious depression or  anxiety, speech and thought processing grossly intact  ASSESSMENT AND PLAN:  Discussed the following assessment and plan:  Nausea  Diarrhea, unspecified type  -we discussed possible serious and likely etiologies, options for evaluation and workup, limitations of telemedicine visit vs in person visit, treatment, treatment risks and precautions. Pt prefers to treat via telemedicine empirically rather than in person at this moment. Query mild gastroenteritis, GERD, viral illness, COVID19 vs other. She thinks is improving some so has opted for covid testing, avoidance of dairy , antacid. Agrees to follow up with PCP office if positive covid test, worsening, new symptoms arise, or if is not improving with treatment. Discussed options for inperson care if PCP office not available. Did let this patient know that I only do telemedicine on Tuesdays and Thursdays for [facility]. Advised to schedule follow up visit with PCP or [facility] if any further questions or concerns to avoid delays in care.   I discussed the assessment and treatment plan with the patient. The patient was provided an opportunity to ask questions and all were answered. The patient agreed with the plan and demonstrated an understanding of the instructions.     Lucretia Kern, DO

## 2020-06-16 NOTE — Progress Notes (Signed)
Carelink Summary Report / Loop Recorder 

## 2020-06-17 ENCOUNTER — Encounter: Payer: Self-pay | Admitting: Family Medicine

## 2020-06-17 ENCOUNTER — Telehealth: Payer: PPO

## 2020-06-17 NOTE — Telephone Encounter (Signed)
Patient is scheduled   

## 2020-06-24 ENCOUNTER — Other Ambulatory Visit: Payer: Self-pay

## 2020-06-24 ENCOUNTER — Ambulatory Visit (INDEPENDENT_AMBULATORY_CARE_PROVIDER_SITE_OTHER): Payer: PPO | Admitting: Registered Nurse

## 2020-06-24 ENCOUNTER — Encounter: Payer: Self-pay | Admitting: Registered Nurse

## 2020-06-24 VITALS — BP 138/60 | HR 60 | Temp 98.3°F | Resp 18 | Ht 64.0 in | Wt 140.8 lb

## 2020-06-24 DIAGNOSIS — K219 Gastro-esophageal reflux disease without esophagitis: Secondary | ICD-10-CM | POA: Diagnosis not present

## 2020-06-24 DIAGNOSIS — R195 Other fecal abnormalities: Secondary | ICD-10-CM

## 2020-06-24 LAB — CBC WITH DIFFERENTIAL/PLATELET
Basophils Absolute: 0 10*3/uL (ref 0.0–0.1)
Basophils Relative: 0.9 % (ref 0.0–3.0)
Eosinophils Absolute: 0.1 10*3/uL (ref 0.0–0.7)
Eosinophils Relative: 2.6 % (ref 0.0–5.0)
HCT: 35.5 % — ABNORMAL LOW (ref 36.0–46.0)
Hemoglobin: 12.1 g/dL (ref 12.0–15.0)
Lymphocytes Relative: 42 % (ref 12.0–46.0)
Lymphs Abs: 2.2 10*3/uL (ref 0.7–4.0)
MCHC: 34 g/dL (ref 30.0–36.0)
MCV: 90.3 fl (ref 78.0–100.0)
Monocytes Absolute: 0.4 10*3/uL (ref 0.1–1.0)
Monocytes Relative: 6.6 % (ref 3.0–12.0)
Neutro Abs: 2.5 10*3/uL (ref 1.4–7.7)
Neutrophils Relative %: 47.9 % (ref 43.0–77.0)
Platelets: 264 10*3/uL (ref 150.0–400.0)
RBC: 3.93 Mil/uL (ref 3.87–5.11)
RDW: 13.2 % (ref 11.5–15.5)
WBC: 5.3 10*3/uL (ref 4.0–10.5)

## 2020-06-24 NOTE — Progress Notes (Signed)
Acute Office Visit  Subjective:    Patient ID: Ruth Wilson, female    DOB: [birth year], 80 y.o.   MRN: [id number]  Chief Complaint  Patient presents with   Abdominal Pain    Patient states since last  2 week she was having some severe abdominal pain and also had some dark stool. Patient has some stomach pain while eating but does not know if it is heartburn. Patient is no longer having pain and stool is regular color again.    HPI Patient is in today for dark stools  On and off last week Virtual visit last week for abdominal pain, epigastric.  Pt made decision to treat conservatively with dietary restrictions, supportive care, and home covid testing. She has seen some improvement but wants to ensure she has no evidence of more serious etiology  No recent weight loss, vomiting, or other red flags. Up to date on colon ca screening.  Does note some epigastric pain around meals - usually after.  Past Medical History:  Diagnosis Date   Allergy    cat dander and seasonal   Cataract    Diverticulosis of colon without hemorrhage 04/30/2012   Hypertension    Osteopenia    Osteoporosis     Past Surgical History:  Procedure Laterality Date   cataract surgery     tattoo on eye due to damaged iris   COLONOSCOPY     OOPHORECTOMY     bilateral   POLYPECTOMY     TONSILLECTOMY     TOTAL ABDOMINAL HYSTERECTOMY      Family History  Problem Relation Age of Onset   Heart disease Mother        MI age 60 but lived to 37   Colon cancer Father        died 46 from colon cancer   Colon polyps Neg Hx    Esophageal cancer Neg Hx    Rectal cancer Neg Hx    Stomach cancer Neg Hx     Social History   Socioeconomic History   Marital status: Married    Spouse name: Not on file   Number of children: 3   Years of education: Not on file   Highest education level: Not on file  Occupational History   Occupation: Retired    Fish farm manager: RETIRED  Tobacco Use   Smoking status: Never    Smokeless tobacco: Never  Vaping Use   Vaping Use: Never used  Substance and Sexual Activity   Alcohol use: Yes    Alcohol/week: 14.0 standard drinks    Types: 14 Standard drinks or equivalent per week    Comment: advised to cut to 1 a day-red wine   Drug use: No   Sexual activity: Not on file  Other Topics Concern   Not on file  Social History Narrative   Married 51 years in 2015. 3 children. Stacy lives in [place] middle divorced with 3 boys, Clair Gulling oldest living in [place] has 6 children and is a Restaurant manager, fast food, Gershon Mussel lives in [place] no children but 4 dogs.       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, contracting of nursing)      Hobbies: golf, travel, exercise   Social Determinants of Radio broadcast assistant Strain: Not on [employer] Insecurity: No Food Insecurity   Worried About Charity fundraiser in the Last Year: Never true   Arboriculturist in the Last Year: Never true  Transportation Needs:  Not on file  Physical Activity: Not on file  Stress: Not on file  Social Connections: Not on file  Intimate Partner Violence: Not on file    Outpatient Medications Prior to Visit  Medication Sig Dispense Refill   Albuterol Sulfate (PROAIR RESPICLICK) 628 (90 Base) MCG/ACT AEPB Inhale 2 puffs into the lungs every 6 (six) hours as needed (wheezing or shortness of breath with cat exposure). 1 each 1   Calcium Carbonate-Vit D-Min (CALCIUM 1200 PO) Take 1,200 Units by mouth 2 (two) times daily.      Cholecalciferol (VITAMIN D-3 PO) Take 2,000 Units by mouth daily.     Coenzyme Q10 (CO Q 10 PO) Take 1 capsule by mouth.      Cyanocobalamin (VITAMIN B-12) 1000 MCG SUBL Place 1,000 mcg under the tongue.     hydroxypropyl methylcellulose / hypromellose (ISOPTO TEARS / GONIOVISC) 2.5 % ophthalmic solution Place 1 drop into both eyes as needed for dry eyes.     Ibuprofen-Diphenhydramine Cit (ADVIL PM PO) Take by mouth.     magnesium oxide (MAG-OX) 400 MG tablet Take 400 mg by mouth daily.      meclizine (ANTIVERT) 25 MG tablet Take 1 tablet (25 mg total) by mouth 3 (three) times daily as needed for dizziness. 20 tablet 0   ondansetron (ZOFRAN ODT) 4 MG disintegrating tablet Take 1 tablet (4 mg total) by mouth every 8 (eight) hours as needed for nausea or vomiting. 20 tablet 0   TURMERIC PO Take 1 Dose by mouth daily.     metoprolol tartrate (LOPRESSOR) 25 MG tablet Take 1 tablet (25 mg total) by mouth 2 (two) times daily. 180 tablet 3   QUERCETIN PO Take 500 mg by mouth daily. (Patient not taking: Reported on 08/03/2020)     zinc gluconate 50 MG tablet Take 50 mg by mouth daily. (Patient not taking: Reported on 08/03/2020)     No facility-administered medications prior to visit.    Allergies  Allergen Reactions   Alendronate Sodium     REACTION: GI problems fosamax   Iodides Hives   Risedronate Sodium     REACTION: GI problems boniva    Review of Systems Per hpi      Objective:    Physical Exam Vitals and nursing note reviewed.  Constitutional:      General: She is not in acute distress.    Appearance: Normal appearance. She is not ill-appearing, toxic-appearing or diaphoretic.  Cardiovascular:     Rate and Rhythm: Normal rate and regular rhythm.     Pulses: Normal pulses.     Heart sounds: Normal heart sounds. No murmur heard.   No friction rub. No gallop.  Pulmonary:     Effort: Pulmonary effort is normal. No respiratory distress.     Breath sounds: Normal breath sounds. No stridor. No wheezing, rhonchi or rales.  Chest:     Chest wall: No tenderness.  Abdominal:     General: Abdomen is flat. Bowel sounds are normal. There is no distension or abdominal bruit. There are no signs of injury.     Palpations: Abdomen is soft.     Tenderness: There is no abdominal tenderness. There is no guarding.     Hernia: No hernia is present.  Skin:    General: Skin is warm and dry.     Capillary Refill: Capillary refill takes less than 2 seconds.  Neurological:     General:  No focal deficit present.     Mental Status:  She is alert and oriented to person, place, and time. Mental status is at baseline.  Psychiatric:        Mood and Affect: Mood normal.        Behavior: Behavior normal.        Thought Content: Thought content normal.        Judgment: Judgment normal.    BP 138/60   Pulse 60   Temp 98.3 F (36.8 C) (Temporal)   Resp 18   Ht 5\' 4"  (1.626 m)   Wt 140 lb 12.8 oz (63.9 kg)   LMP  (LMP Unknown)   SpO2 99%   BMI 24.17 kg/m  Wt Readings from Last 3 Encounters:  08/28/20 139 lb 3.2 oz (63.1 kg)  08/03/20 138 lb 9.6 oz (62.9 kg)  06/24/20 140 lb 12.8 oz (63.9 kg)    Health Maintenance Due  Topic Date Due   COVID-19 Vaccine (3 - Booster for Moderna series) 05/12/2019   INFLUENZA VACCINE  08/10/2020    There are no preventive care reminders to display for this patient.   Lab Results  Component Value Date   TSH 1.27 11/18/2019   Lab Results  Component Value Date   WBC 5.5 08/03/2020   HGB 12.3 08/03/2020   HCT 37.0 08/03/2020   MCV 91.4 08/03/2020   PLT 269.0 08/03/2020   Lab Results  Component Value Date   NA 130 (L) 08/03/2020   K 4.7 08/03/2020   CO2 26 08/03/2020   GLUCOSE 87 08/03/2020   BUN 8 08/03/2020   CREATININE 0.71 08/03/2020   BILITOT 0.5 08/03/2020   ALKPHOS 54 08/03/2020   AST 20 08/03/2020   ALT 11 08/03/2020   PROT 6.9 08/03/2020   ALBUMIN 4.2 08/03/2020   CALCIUM 9.1 08/03/2020   ANIONGAP 10 07/30/2018   GFR 81.13 08/03/2020   Lab Results  Component Value Date   CHOL 183 04/24/2019   Lab Results  Component Value Date   HDL 70.60 04/24/2019   Lab Results  Component Value Date   LDLCALC 101 (H) 04/24/2019   Lab Results  Component Value Date   TRIG 54.0 04/24/2019   Lab Results  Component Value Date   CHOLHDL 3 04/24/2019   No results found for: HGBA1C     Assessment & Plan:   Problem List Items Addressed This Visit       Digestive   GERD   Relevant Orders   CBC with  Differential/Platelet (Completed)   H Pylori, IGM, IGG, IGA AB (Completed)   Other Visit Diagnoses     Dark stools    -  Primary   Relevant Orders   CBC with Differential/Platelet (Completed)   H Pylori, IGM, IGG, IGA AB (Completed)        No orders of the defined types were placed in this encounter.  PLAN Unremarkable exam.  Will obtain labs to ensure no evidence of GI bleed or h pylori. Close follow up with PCP if symptoms persist or worsen Patient encouraged to call clinic with any questions, comments, or concerns.   Maximiano Coss, NP

## 2020-06-24 NOTE — Patient Instructions (Addendum)
Ms. Ruth Wilson to see you. I'm reassured by your improvement in symptoms  My suspicion is that the pepto bismol that you used lead to darker stools. We will check blood counts today for signs of anemia in case there is any concern for GI bleed. We will also check for H Pylori - this is a bacteria that can cause those stomach pain symptoms.  If labs are concerning I'll call you - otherwise I'll post results to MyChart!   Thank you  [name]    If you have lab work done today you will be contacted with your lab results within the next 2 weeks.  If you have not heard from [place] then please contact us. The fastest way to get your results is to register for My Chart.   IF you received an x-ray today, you will receive an invoice from [facility]. Please contact [facility] at [phone] with questions or concerns regarding your invoice.   IF you received labwork today, you will receive an invoice from [facility]. Please contact [facility] at [phone] with questions or concerns regarding your invoice.   Our billing staff will not be able to assist you with questions regarding bills from these companies.  You will be contacted with the lab results as soon as they are available. The fastest way to get your results is to activate your My Chart account. Instructions are located on the last page of this paperwork. If you have not heard from [place] regarding the results in 2 weeks, please contact this office.

## 2020-06-26 LAB — H PYLORI, IGM, IGG, IGA AB
H pylori, IgM Abs: <9 units (ref 0.0–8.9)
H. pylori, IgA Abs: <9 units (ref 0.0–8.9)
H. pylori, IgG AbS: 0.1 Index Value (ref 0.00–0.79)

## 2020-06-29 ENCOUNTER — Ambulatory Visit (INDEPENDENT_AMBULATORY_CARE_PROVIDER_SITE_OTHER): Payer: PPO

## 2020-06-29 DIAGNOSIS — R55 Syncope and collapse: Secondary | ICD-10-CM

## 2020-06-30 LAB — CUP PACEART REMOTE DEVICE CHECK
Date Time Interrogation Session: 20220616231702
Implantable Pulse Generator Implant Date: 20200813

## 2020-07-09 ENCOUNTER — Telehealth: Payer: Self-pay

## 2020-07-09 NOTE — Telephone Encounter (Signed)
LMOVM for patient to send a manual transmission with her home remote monitor.  ILR alert for tachy episode, EGM shows NCT with rates 162bpm occurring on 06/21/20 @ 0840. 21 episodes logged, only 1 downloaded with alert. Will need manual for further review of other episodes to see/review onset and duration. No symptom episode.  Routing to [employer] for manual transmission

## 2020-07-10 NOTE — Telephone Encounter (Signed)
Patient agreed to send a manual transmission with her home remote monitor.

## 2020-07-10 NOTE — Telephone Encounter (Signed)
Full Transmission received 07/10/2020.

## 2020-07-10 NOTE — Telephone Encounter (Signed)
Full transmission received and reviewed.  On 6/12 pt had 20 tachy episodes between 0840 and 0930.  They appear SVT with peak V rates into the 180s  Spoke with pt, she does not recall any symptoms or what she was doing on this date.  She does not have a known history of SVT.  She was implanted for syncope.    Current meds include Metoprolol 25mg  BID, which pt reports compliance with.       First Episode   Fastest episode

## 2020-07-11 NOTE — Telephone Encounter (Signed)
Ruth Wilson  there appears to be a pwave in the ascending limb of the twave esp visible in the lower strips Lets check her as this is probably a secondary phenomenon as opposed to primary arrhtyhmia ( although it could be atrial tach)  Virl Axe

## 2020-07-17 NOTE — Progress Notes (Signed)
Carelink Summary Report / Loop Recorder 

## 2020-07-29 ENCOUNTER — Encounter: Payer: Self-pay | Admitting: Family Medicine

## 2020-07-29 LAB — CUP PACEART REMOTE DEVICE CHECK
Date Time Interrogation Session: 20220719231934
Implantable Pulse Generator Implant Date: 20200813

## 2020-07-30 ENCOUNTER — Encounter: Payer: Self-pay | Admitting: Family Medicine

## 2020-08-03 ENCOUNTER — Other Ambulatory Visit: Payer: Self-pay

## 2020-08-03 ENCOUNTER — Ambulatory Visit (INDEPENDENT_AMBULATORY_CARE_PROVIDER_SITE_OTHER): Payer: PPO

## 2020-08-03 ENCOUNTER — Encounter: Payer: Self-pay | Admitting: Family Medicine

## 2020-08-03 ENCOUNTER — Ambulatory Visit: Payer: PPO | Admitting: Family Medicine

## 2020-08-03 ENCOUNTER — Ambulatory Visit (INDEPENDENT_AMBULATORY_CARE_PROVIDER_SITE_OTHER): Payer: PPO | Admitting: Family Medicine

## 2020-08-03 VITALS — BP 148/78 | HR 64 | Temp 97.4°F | Resp 15 | Ht 64.0 in | Wt 138.6 lb

## 2020-08-03 DIAGNOSIS — R55 Syncope and collapse: Secondary | ICD-10-CM

## 2020-08-03 DIAGNOSIS — R1013 Epigastric pain: Secondary | ICD-10-CM

## 2020-08-03 DIAGNOSIS — K253 Acute gastric ulcer without hemorrhage or perforation: Secondary | ICD-10-CM

## 2020-08-03 MED ORDER — OMEPRAZOLE 40 MG PO CPDR: 40.0000 mg | DELAYED_RELEASE_CAPSULE | Freq: Every day | ORAL | 1 refills | Status: DC

## 2020-08-03 NOTE — Progress Notes (Signed)
Phone [phone] In person visit   Subjective:   Ruth Wilson is a 80 y.o. year old very pleasant female patient who presents for/with See problem oriented charting Chief Complaint  Patient presents with   Acute Visit    Abdominal pain x 1 week located in the upper center abdomen. Pt stated that it is very sore to the touch and that pepto bismol seems to be the only thing helping her.     This visit occurred during the SARS-CoV-2 public health emergency.  Safety protocols were in place, including screening questions prior to the visit, additional usage of staff PPE, and extensive cleaning of exam room while observing appropriate contact time as indicated for disinfecting solutions.   Past Medical History-  Patient Active Problem List   Diagnosis Date Noted   Dizzy spells 06/20/2017    Priority: High   Hyperlipidemia, unspecified 09/30/2013    Priority: Medium   Osteoporosis 10/17/2006    Priority: Medium   Advanced directives, counseling/discussion 10/30/2015    Priority: Low   Hip pain, bilateral 04/30/2015    Priority: Low   Hemorrhoids 09/30/2013    Priority: Low   Allergy to cats 09/30/2013    Priority: Low   Family hx of colon cancer 04/30/2012    Priority: Low   GERD 02/09/2010    Priority: Low   INSOMNIA-SLEEP DISORDER-UNSPEC 02/09/2010    Priority: Low   NSVT (nonsustained ventricular tachycardia) ([place]) 04/24/2019    Medications- reviewed and updated Current Outpatient Medications  Medication Sig Dispense Refill   Albuterol Sulfate (PROAIR RESPICLICK) 123XX123 (90 Base) MCG/ACT AEPB Inhale 2 puffs into the lungs every 6 (six) hours as needed (wheezing or shortness of breath with cat exposure). 1 each 1   Calcium Carbonate-Vit D-Min (CALCIUM 1200 PO) Take 1,200 Units by mouth 2 (two) times daily.      Cholecalciferol (VITAMIN D-3 PO) Take 2,000 Units by mouth daily.     Coenzyme Q10 (CO Q 10 PO) Take 1 capsule by mouth.      Cyanocobalamin (VITAMIN B-12)  1000 MCG SUBL Place 1,000 mcg under the tongue.     hydroxypropyl methylcellulose / hypromellose (ISOPTO TEARS / GONIOVISC) 2.5 % ophthalmic solution Place 1 drop into both eyes as needed for dry eyes.     Ibuprofen-Diphenhydramine Cit (ADVIL PM PO) Take by mouth.     magnesium oxide (MAG-OX) 400 MG tablet Take 400 mg by mouth daily.     meclizine (ANTIVERT) 25 MG tablet Take 1 tablet (25 mg total) by mouth 3 (three) times daily as needed for dizziness. 20 tablet 0   metoprolol tartrate (LOPRESSOR) 25 MG tablet Take 1 tablet (25 mg total) by mouth 2 (two) times daily. 180 tablet 3   omeprazole (PRILOSEC) 40 MG capsule Take 1 capsule (40 mg total) by mouth daily. 30 capsule 1   ondansetron (ZOFRAN ODT) 4 MG disintegrating tablet Take 1 tablet (4 mg total) by mouth every 8 (eight) hours as needed for nausea or vomiting. 20 tablet 0   TURMERIC PO Take 1 Dose by mouth daily.     No current facility-administered medications for this visit.     Objective:  BP (!) 148/78 (BP Location: Left Arm, Patient Position: Sitting, Cuff Size: Normal)   Pulse 64   Temp (!) 97.4 F (36.3 C) (Temporal)   Resp 15   Ht '5\' 4"'$  (1.626 m)   Wt 138 lb 9.6 oz (62.9 kg)   LMP  (LMP Unknown)   SpO2  97%   BMI 23.79 kg/m  Gen: NAD, resting comfortably CV: RRR no murmurs rubs or gallops Lungs: CTAB no crackles, wheeze, rhonchi Abdomen: soft/nontender other than moderate pain epigastric abdomen/nondistended/normal bowel sounds. No rebound or guarding.  Ext: no edema Skin: warm, dry    Assessment and Plan   #Epigastric Abdominal pain with long term NSAID use S: Currently, Patient reports 1 week of epigastric abdominal pain.  She feels very sore to the touch/tender in this area.  Pepto-Bismol seems to help her some. She is afraid to eat as aggravates symptoms. Pain up to 9-10 agonizing discomfort at night. Worse at night. Getting nausea again. When settles down able to sleep through night. Still taking half an advil  pm at night. No blood in stool. No dark black stool other than with pepto bismol- returns to normal color eventually.  No chest pain or shortness of breath with this.    She had been seen June 2 by Dr. Maudie Mercury for nausea and diarrhea-COVID-19 testing was negative-over-the-counter antacid was discussed. Of note she had a visit on 06/24/2020 with Maximiano Coss of [facility]-hematocrit was very slightly low and H. pylori testing was negative-I am unable to access full note at this time-appears to still be in the pending process. Has had dark stools with pepto bismol and can darken tongue and does tums in between. Both of those help and in particular the peto bismol. Symptoms improved previously before current symptoms A/P: 80 year old female with long-term use of anti-inflammatories with recurrent epigastric pain that can be rather significant which worsens after eating but is present at all times concerning for gastric ulcer/stomach ulcer.  We will update labs today to evaluate liver function, pancreatic enzyme levels, kidney function.  We will treat with omeprazole 40 mg daily for absolute minimum of 1 month to help heal ulcer and if she has any lingering symptoms towards the end of the 1 month should take full 2 months.  If does not improve or symptoms worsen I want her to seek care immediately-we would need GI consult for possible EGD in that case. Check CBC to make sure no worsening hct levels.    #white coat syndrome- home readings have been good - averages around 120- slightly high in office today- will not change meds  Recommended follow up:  since you are moving at end of October- cancel your visit in early November before you leave. Can double check and see if I have any openings/cancellations. Future Appointments  Date Time Provider [place]  08/28/2020 10:40 AM Shirley Friar, PA-C [facility]  09/07/2020  9:05 AM [facility] DEVICE REMOTES [facility]  10/12/2020  9:05 AM [facility] DEVICE REMOTES [facility]  11/16/2020  9:05 AM [facility] DEVICE REMOTES [facility]  11/17/2020 10:40 AM Marin Olp, MD [facility]  12/21/2020  9:05 AM [facility] DEVICE REMOTES [facility]  01/25/2021  9:05 AM [facility] DEVICE REMOTES [facility]    Lab/Order associations:   ICD-10-CM   1. Epigastric abdominal pain  R10.13 CBC with Differential/Platelet    Comprehensive metabolic panel    Lipase      Meds ordered this encounter  Medications   omeprazole (PRILOSEC) 40 MG capsule    Sig: Take 1 capsule (40 mg total) by mouth daily.    Dispense:  30 capsule    Refill:  1   Return precautions advised.  Garret Reddish, MD

## 2020-08-03 NOTE — Patient Instructions (Addendum)
80 year old female with long-term use of anti-inflammatories with recurrent epigastric pain that can be rather significant which worsens after eating but is present at all times concerning for gastric ulcer/stomach ulcer.  We will update labs today to evaluate liver function, pancreatic enzyme levels, kidney function.  We will treat with omeprazole 40 mg daily for absolute minimum of 1 month to help heal ulcer and if she has any lingering symptoms towards the end of the 1 month should take full 2 months.  If does not improve or symptoms worsen (or if you have blood in stool, dark black stool without pepto bismol, new vomiting particularly if there is blood)  I want her to seek care immediately-we would need GI consult for possible EGD in that case.  Please stop by lab before you go If you have mychart- we will send your results within 3 business days of [place] receiving them.  If you do not have mychart- we will call you about results within 5 business days of [place] receiving them.  *please also note that you will see labs on mychart as soon as they post. I will later go in and write notes on them- will say "notes from Dr. Yong Channel"  Really hoping I get to see you for physical before you leave- we will certainly miss yall- has been an honor to be your physician  Recommended follow up: since you are moving at end of October- cancel your visit in early November before you leave. Can double check and see if I have any openings/cancellations.

## 2020-08-04 LAB — COMPREHENSIVE METABOLIC PANEL
ALT: 11 U/L (ref 0–35)
AST: 20 U/L (ref 0–37)
Albumin: 4.2 g/dL (ref 3.5–5.2)
Alkaline Phosphatase: 54 U/L (ref 39–117)
BUN: 8 mg/dL (ref 6–23)
CO2: 26 mEq/L (ref 19–32)
Calcium: 9.1 mg/dL (ref 8.4–10.5)
Chloride: 96 mEq/L (ref 96–112)
Creatinine, Ser: 0.71 mg/dL (ref 0.40–1.20)
GFR: 81.13 mL/min (ref >60.00)
Glucose, Bld: 87 mg/dL (ref 70–99)
Potassium: 4.7 mEq/L (ref 3.5–5.1)
Sodium: 130 mEq/L — ABNORMAL LOW (ref 135–145)
Total Bilirubin: 0.5 mg/dL (ref 0.2–1.2)
Total Protein: 6.9 g/dL (ref 6.0–8.3)

## 2020-08-04 LAB — CBC WITH DIFFERENTIAL/PLATELET
Basophils Absolute: 0 10*3/uL (ref 0.0–0.1)
Basophils Relative: 0.7 % (ref 0.0–3.0)
Eosinophils Absolute: 0.1 10*3/uL (ref 0.0–0.7)
Eosinophils Relative: 1.8 % (ref 0.0–5.0)
HCT: 37 % (ref 36.0–46.0)
Hemoglobin: 12.3 g/dL (ref 12.0–15.0)
Lymphocytes Relative: 41 % (ref 12.0–46.0)
Lymphs Abs: 2.3 10*3/uL (ref 0.7–4.0)
MCHC: 33.3 g/dL (ref 30.0–36.0)
MCV: 91.4 fl (ref 78.0–100.0)
Monocytes Absolute: 0.4 10*3/uL (ref 0.1–1.0)
Monocytes Relative: 6.8 % (ref 3.0–12.0)
Neutro Abs: 2.7 10*3/uL (ref 1.4–7.7)
Neutrophils Relative %: 49.7 % (ref 43.0–77.0)
Platelets: 269 10*3/uL (ref 150.0–400.0)
RBC: 4.04 Mil/uL (ref 3.87–5.11)
RDW: 13.2 % (ref 11.5–15.5)
WBC: 5.5 10*3/uL (ref 4.0–10.5)

## 2020-08-04 LAB — LIPASE: Lipase: 30 U/L (ref 11.0–59.0)

## 2020-08-20 ENCOUNTER — Telehealth: Payer: Self-pay | Admitting: Pharmacist

## 2020-08-20 NOTE — Chronic Care Management (AMB) (Addendum)
Chronic Care Management Pharmacy Assistant   Name: SANDY JOENS  MRN: [id number] DOB: Dec 09, 1940   Reason for Encounter: General Adherence Call    Recent office visits:  08/03/2020 [facility] (PCP) Marin Olp, MD; Omeprazole 40 mg daily for one month to help heal ulcer and if any lingering symptoms take 2 months.  Recent consult visits:  None  Hospital visits:  None in previous 6 months  Medications: Outpatient Encounter Medications as of 08/20/2020  Medication Sig   Albuterol Sulfate (PROAIR RESPICLICK) 123XX123 (90 Base) MCG/ACT AEPB Inhale 2 puffs into the lungs every 6 (six) hours as needed (wheezing or shortness of breath with cat exposure).   Calcium Carbonate-Vit D-Min (CALCIUM 1200 PO) Take 1,200 Units by mouth 2 (two) times daily.    Cholecalciferol (VITAMIN D-3 PO) Take 2,000 Units by mouth daily.   Coenzyme Q10 (CO Q 10 PO) Take 1 capsule by mouth.    Cyanocobalamin (VITAMIN B-12) 1000 MCG SUBL Place 1,000 mcg under the tongue.   hydroxypropyl methylcellulose / hypromellose (ISOPTO TEARS / GONIOVISC) 2.5 % ophthalmic solution Place 1 drop into both eyes as needed for dry eyes.   Ibuprofen-Diphenhydramine Cit (ADVIL PM PO) Take by mouth.   magnesium oxide (MAG-OX) 400 MG tablet Take 400 mg by mouth daily.   meclizine (ANTIVERT) 25 MG tablet Take 1 tablet (25 mg total) by mouth 3 (three) times daily as needed for dizziness.   metoprolol tartrate (LOPRESSOR) 25 MG tablet Take 1 tablet (25 mg total) by mouth 2 (two) times daily.   omeprazole (PRILOSEC) 40 MG capsule Take 1 capsule (40 mg total) by mouth daily.   ondansetron (ZOFRAN ODT) 4 MG disintegrating tablet Take 1 tablet (4 mg total) by mouth every 8 (eight) hours as needed for nausea or vomiting.   TURMERIC PO Take 1 Dose by mouth daily.   No facility-administered encounter medications on file as of 08/20/2020.   Patient Questions: Have you had any problems recently with your health? Patient states she has not  had any problems recently with her health.  Have you had any problems with your pharmacy? Patient states she has not had any problems recently with her pharmacy.  What issues or side effects are you having with your medications? Patient states she is not currently having any issues or side effects with any of her medications.  What would you like me to pass along to Leata Mouse, CPP for him to help you with?  Patient states medication Omeprazole 40 mg has helped her tremendously. Patient states she was told to only take this medication daily for 2 month. Patient would like to know if it is okay for her to take 20 mg daily after she finishes her two month course of 40 mg.  What can we do to take care of you better? Patient did not have any suggestions.  Future Appointments  Date Time Provider [place]  08/28/2020 10:40 AM Shirley Friar, PA-C [facility]  09/07/2020  9:05 AM [facility] DEVICE REMOTES [facility]  10/12/2020  9:05 AM [facility] DEVICE REMOTES [facility]  11/16/2020  9:05 AM [facility] DEVICE REMOTES [facility]  11/17/2020 10:40 AM Marin Olp, MD [facility]  12/21/2020  9:05 AM [facility] DEVICE REMOTES [facility]  01/25/2021  9:05 AM [facility] DEVICE REMOTES [facility]    Star Rating Drugs: None  April D Calhoun, [place] Pharmacist Assistant [phone]   10 minutes spent in review, coordination, and documentation.  This  is not a medication that I like to continue indefinitely because it does have negative long term effects.  I would suggest to contact the office after her two months are almost up and we can discuss plan of care at that time.  Often times we will taper it down and evaluate next steps based on how her symptoms are.  Hope this helps!  Reviewed by: Beverly Milch, PharmD Clinical Pharmacist [phone]

## 2020-08-26 NOTE — Progress Notes (Signed)
Carelink Summary Report / Loop Recorder 

## 2020-08-28 ENCOUNTER — Ambulatory Visit (INDEPENDENT_AMBULATORY_CARE_PROVIDER_SITE_OTHER): Payer: PPO | Admitting: Student

## 2020-08-28 ENCOUNTER — Encounter: Payer: Self-pay | Admitting: Student

## 2020-08-28 ENCOUNTER — Other Ambulatory Visit: Payer: Self-pay

## 2020-08-28 VITALS — BP 112/64 | HR 54 | Ht 64.0 in | Wt 139.2 lb

## 2020-08-28 DIAGNOSIS — I471 Supraventricular tachycardia: Secondary | ICD-10-CM | POA: Diagnosis not present

## 2020-08-28 DIAGNOSIS — I4729 Other ventricular tachycardia: Secondary | ICD-10-CM

## 2020-08-28 DIAGNOSIS — H21262 Iris atrophy (essential) (progressive), left eye: Secondary | ICD-10-CM | POA: Diagnosis not present

## 2020-08-28 DIAGNOSIS — R55 Syncope and collapse: Secondary | ICD-10-CM | POA: Diagnosis not present

## 2020-08-28 DIAGNOSIS — Z961 Presence of intraocular lens: Secondary | ICD-10-CM | POA: Diagnosis not present

## 2020-08-28 DIAGNOSIS — I472 Ventricular tachycardia: Secondary | ICD-10-CM | POA: Diagnosis not present

## 2020-08-28 LAB — CUP PACEART INCLINIC DEVICE CHECK
Date Time Interrogation Session: 20220819111451
Implantable Pulse Generator Implant Date: 20200813

## 2020-08-28 NOTE — Addendum Note (Signed)
Addended by: Glean Salen on: 08/28/2020 04:18 PM   Modules accepted: Orders

## 2020-08-28 NOTE — Patient Instructions (Signed)
Medication Instructions:  °Your physician recommends that you continue on your current medications as directed. Please refer to the Current Medication list given to you today. ° °*If you need a refill on your cardiac medications before your next appointment, please call your pharmacy* ° ° °Lab Work: °None °If you have labs (blood work) drawn today and your tests are completely normal, you will receive your results only by: °MyChart Message (if you have MyChart) OR °A paper copy in the mail °If you have any lab test that is abnormal or we need to change your treatment, we will call you to review the results. ° ° °Follow-Up: °At [facility], you and your health needs are our priority.  As part of our continuing mission to provide you with exceptional heart care, we have created designated Provider Care Teams.  These Care Teams include your primary Cardiologist (physician) and Advanced Practice Providers (APPs -  Physician Assistants and Nurse Practitioners) who all work together to provide you with the care you need, when you need it. ° ° °Your next appointment:   °As needed °

## 2020-08-28 NOTE — Progress Notes (Signed)
Electrophysiology Office Note Date: 08/28/2020  ID:  Ruth Wilson, DOB December 20, 1940, MRN [id number]  PCP: Ruth Olp, MD Primary Cardiologist: Ruth Ruths, MD Electrophysiologist: Ruth Axe, MD   CC: ILR follow-up  Ruth Wilson is a 80 y.o. female seen today for Dr. Caryl Wilson . she presents today for routine electrophysiology followup.  Since last being seen in our clinic, the patient reports doing very well. She has not noticed any of the short SVT noted on her device. Episodes have each correlated with exercise, and have only occurred on 2 separate days, one in June and the other this month. she denies chest pain, palpitations, dyspnea, PND, orthopnea, nausea, vomiting, dizziness, syncope, edema, weight gain, or early satiety.  Device History: Medtronic loop recorder implanted 2020 for syncope  Past Medical History:  Diagnosis Date   Allergy    cat dander and seasonal   Cataract    Diverticulosis of colon without hemorrhage 04/30/2012   Hypertension    Osteopenia    Osteoporosis    Past Surgical History:  Procedure Laterality Date   cataract surgery     tattoo on eye due to damaged iris   COLONOSCOPY     OOPHORECTOMY     bilateral   POLYPECTOMY     TONSILLECTOMY     TOTAL ABDOMINAL HYSTERECTOMY      Current Outpatient Medications  Medication Sig Dispense Refill   Albuterol Sulfate (PROAIR RESPICLICK) 123XX123 (90 Base) MCG/ACT AEPB Inhale 2 puffs into the lungs every 6 (six) hours as needed (wheezing or shortness of breath with cat exposure). 1 each 1   Calcium Carbonate-Vit D-Min (CALCIUM 1200 PO) Take 1,200 Units by mouth 2 (two) times daily.      Cholecalciferol (VITAMIN D-3 PO) Take 2,000 Units by mouth daily.     Coenzyme Q10 (CO Q 10 PO) Take 1 capsule by mouth.      Cyanocobalamin (VITAMIN B-12) 1000 MCG SUBL Place 1,000 mcg under the tongue.     hydroxypropyl methylcellulose / hypromellose (ISOPTO TEARS / GONIOVISC) 2.5 % ophthalmic solution  Place 1 drop into both eyes as needed for dry eyes.     Ibuprofen-Diphenhydramine Cit (ADVIL PM PO) Take by mouth.     magnesium oxide (MAG-OX) 400 MG tablet Take 400 mg by mouth daily.     meclizine (ANTIVERT) 25 MG tablet Take 1 tablet (25 mg total) by mouth 3 (three) times daily as needed for dizziness. 20 tablet 0   metoprolol tartrate (LOPRESSOR) 25 MG tablet Take 1 tablet (25 mg total) by mouth 2 (two) times daily. 180 tablet 3   omeprazole (PRILOSEC) 40 MG capsule Take 1 capsule (40 mg total) by mouth daily. 30 capsule 1   ondansetron (ZOFRAN ODT) 4 MG disintegrating tablet Take 1 tablet (4 mg total) by mouth every 8 (eight) hours as needed for nausea or vomiting. 20 tablet 0   TURMERIC PO Take 1 Dose by mouth daily.     No current facility-administered medications for this visit.    Allergies:   Alendronate sodium, Iodides, and Risedronate sodium   Social History: Social History   Socioeconomic History   Marital status: Married    Spouse name: Not on file   Number of children: 3   Years of education: Not on file   Highest education level: Not on file  Occupational History   Occupation: Retired    Fish farm manager: RETIRED  Tobacco Use   Smoking status: Never   Smokeless tobacco: Never  Vaping Use   Vaping Use: Never used  Substance and Sexual Activity   Alcohol use: Yes    Alcohol/week: 14.0 standard drinks    Types: 14 Standard drinks or equivalent per week    Comment: advised to cut to 1 a day-red wine   Drug use: No   Sexual activity: Not on file  Other Topics Concern   Not on file  Social History Narrative   Married 51 years in 2015. 3 children. Ruth Wilson lives in [place] middle divorced with 3 boys, Ruth Wilson oldest living in [place] has 6 children and is a Restaurant manager, fast food, Ruth Wilson lives in [place] no children but 4 dogs.       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, contracting of nursing)      Hobbies: golf, travel, exercise   Social Determinants of Radio broadcast assistant Strain:  Not on [employer] Insecurity: No Food Insecurity   Worried About Charity fundraiser in the Last Year: Never true   Arboriculturist in the Last Year: Never true  Transportation Needs: No Data processing manager (Medical): No   Lack of Transportation (Non-Medical): No  Physical Activity: Not on file  Stress: Not on file  Social Connections: Not on file  Intimate Partner Violence: Not on file    Family History: Family History  Problem Relation Age of Onset   Heart disease Mother        MI age 41 but lived to 58   Colon cancer Father        died 51 from colon cancer   Colon polyps Neg Hx    Esophageal cancer Neg Hx    Rectal cancer Neg Hx    Stomach cancer Neg Hx      Review of Systems: All other systems reviewed and are otherwise negative except as noted above.  Physical Exam: Vitals:   08/28/20 1038  BP: 112/64  Pulse: (!) 54  SpO2: 99%  Weight: 139 lb 3.2 oz (63.1 kg)  Height: '5\' 4"'$  (1.626 m)     GEN- The patient is well appearing, alert and oriented x 3 today.   HEENT: normocephalic, atraumatic; sclera clear, conjunctiva pink; hearing intact; oropharynx clear; neck supple  Lungs- Clear to ausculation bilaterally, normal work of breathing.  No wheezes, rales, rhonchi Heart- Regular rate and rhythm, no murmurs, rubs or gallops  GI- soft, non-tender, non-distended, bowel sounds present  Extremities- no clubbing, cyanosis, or edema  MS- no significant deformity or atrophy Skin- warm and dry, no rash or lesion; PPM pocket well healed Psych- euthymic mood, full affect Neuro- strength and sensation are intact  PPM Interrogation- reviewed in detail today,  See PACEART report  EKG:  EKG is ordered today. The ekg ordered today shows sinus bradycardia at 54 bpm  Recent Labs: 11/18/2019: TSH 1.27 08/03/2020: ALT 11; BUN 8; Creatinine, Ser 0.71; Hemoglobin 12.3; Platelets 269.0; Potassium 4.7; Sodium 130   Wt Readings from Last 3 Encounters:   08/28/20 139 lb 3.2 oz (63.1 kg)  08/03/20 138 lb 9.6 oz (62.9 kg)  06/24/20 140 lb 12.8 oz (63.9 kg)     Other studies Reviewed: Additional studies/ records that were reviewed today include: Echo 06/2017 shows LVEF 55-60%, Previous EP office notes, Previous remote checks, Most recent labwork.   Assessment and Plan:  1. Syncope s/p Medtronic Loop recorder Normal device function See Pace Art report No changes today  2. SVT Asymptomatic. Noted on device. Times associated  appear to be with exercise.  CMET and CBC 08/03/20 stable. In shared decision making, will continue watchful waiting as she is asymptomatic and somewhat bradycardia at baseline.   Current medicines are reviewed at length with the patient today.   The patient does not have concerns regarding her medicines.  The following changes were made today:  none  Labs/ tests ordered today include:  Recent labwork stable.   Disposition:   Follow up as needed with her move to [place]. She plans to establish with cardiology there. We will continue to follow her remotes until she transfers   Signed, Shirley Friar, PA-C  08/28/2020 11:15 AM  [facility] [address] [place] [address] [phone] (office) [phone] (fax)

## 2020-08-29 ENCOUNTER — Other Ambulatory Visit: Payer: Self-pay | Admitting: Internal Medicine

## 2020-09-07 ENCOUNTER — Ambulatory Visit (INDEPENDENT_AMBULATORY_CARE_PROVIDER_SITE_OTHER): Payer: PPO

## 2020-09-07 DIAGNOSIS — R55 Syncope and collapse: Secondary | ICD-10-CM

## 2020-09-08 LAB — CUP PACEART REMOTE DEVICE CHECK
Date Time Interrogation Session: 20220821235352
Implantable Pulse Generator Implant Date: 20200813

## 2020-09-18 NOTE — Progress Notes (Signed)
Carelink Summary Report / Loop Recorder 

## 2020-09-26 ENCOUNTER — Other Ambulatory Visit: Payer: Self-pay | Admitting: Family Medicine

## 2020-10-12 ENCOUNTER — Ambulatory Visit (INDEPENDENT_AMBULATORY_CARE_PROVIDER_SITE_OTHER): Payer: PPO

## 2020-10-12 DIAGNOSIS — R55 Syncope and collapse: Secondary | ICD-10-CM | POA: Diagnosis not present

## 2020-10-12 LAB — CUP PACEART REMOTE DEVICE CHECK
Date Time Interrogation Session: 20220923235421
Implantable Pulse Generator Implant Date: 20200813

## 2020-10-19 NOTE — Progress Notes (Signed)
Carelink Summary Report / Loop Recorder 

## 2020-11-04 ENCOUNTER — Ambulatory Visit: Payer: PPO | Admitting: Internal Medicine

## 2020-11-05 ENCOUNTER — Telehealth: Payer: Self-pay

## 2020-11-05 ENCOUNTER — Ambulatory Visit (INDEPENDENT_AMBULATORY_CARE_PROVIDER_SITE_OTHER): Payer: PPO

## 2020-11-05 DIAGNOSIS — R55 Syncope and collapse: Secondary | ICD-10-CM

## 2020-11-05 LAB — CUP PACEART REMOTE DEVICE CHECK
Date Time Interrogation Session: 20221027000051
Implantable Pulse Generator Implant Date: 20200813

## 2020-11-05 NOTE — Telephone Encounter (Signed)
ILR alert report received. Battery status OK. Normal device function. No new symptom, tachy, brady, or pause episodes. There were 4 AF episodes detected since 08/16/2020.  The most recent is 11/04/2020 and lasted 3 hours and 40 minutes.  There is not the best ventricular rate control with the AF as heart rates are greater than 110 bpm during the AF episode. Sent to triage. Monthly summary reports and ROV/PRN Kathy Breach, RN, CCDS, [employer]  Successful telephone encounter to patient to assess for s/s of tachy rhythm and compliance with Lopressor 25mg  bid. Patient states she was aware of rapid irregular heart rate as palpitations were felt. Denies symptoms of chest pain/pressure, dizziness, or shortness of breath during 3 hour episode. She is taking her lopressor as prescribed. R-R irregular with no documentation of AF in EMR. Hx of SVT on link 08/28/20. No OAC. Will review with Dr. Caryl Comes and advise patient as needed. Patient appreciative of call.

## 2020-11-10 NOTE — Telephone Encounter (Signed)
Unsuccessful telephone encounter to patient to advise per Dr. Caryl Comes we will continue to monitor SCAF. No changes to current treatment plan at this time. Hipaa compliant VM message left requesting call back to [phone].

## 2020-11-10 NOTE — Telephone Encounter (Signed)
Patient calling back. Discussed watchful waiting for new SCAF onset. All questions answered. Patient encouraged to continue to monitor symptoms and call device clinic with any additional questions or concerns. Patient appreciative of follow up. Will continue to monitor.

## 2020-11-13 NOTE — Progress Notes (Signed)
Carelink Summary Report / Loop Recorder 

## 2020-11-17 ENCOUNTER — Encounter: Payer: PPO | Admitting: Family Medicine

## 2020-12-08 ENCOUNTER — Ambulatory Visit (INDEPENDENT_AMBULATORY_CARE_PROVIDER_SITE_OTHER): Payer: PPO

## 2020-12-08 DIAGNOSIS — R55 Syncope and collapse: Secondary | ICD-10-CM

## 2020-12-08 LAB — CUP PACEART REMOTE DEVICE CHECK
Date Time Interrogation Session: 20221128230550
Implantable Pulse Generator Implant Date: 20200813

## 2020-12-17 NOTE — Progress Notes (Signed)
Carelink Summary Report / Loop Recorder 

## 2021-01-12 ENCOUNTER — Ambulatory Visit (INDEPENDENT_AMBULATORY_CARE_PROVIDER_SITE_OTHER): Payer: Medicare Other

## 2021-01-12 DIAGNOSIS — R55 Syncope and collapse: Secondary | ICD-10-CM

## 2021-01-12 LAB — CUP PACEART REMOTE DEVICE CHECK
Date Time Interrogation Session: 20230102232959
Implantable Pulse Generator Implant Date: 20200813

## 2021-01-21 NOTE — Progress Notes (Signed)
Carelink Summary Report / Loop Recorder 

## 2021-02-14 LAB — CUP PACEART REMOTE DEVICE CHECK
Date Time Interrogation Session: 20230204233214
Implantable Pulse Generator Implant Date: 20200813

## 2021-02-15 ENCOUNTER — Ambulatory Visit (INDEPENDENT_AMBULATORY_CARE_PROVIDER_SITE_OTHER): Payer: Medicare Other

## 2021-02-15 DIAGNOSIS — R55 Syncope and collapse: Secondary | ICD-10-CM

## 2021-02-17 NOTE — Progress Notes (Signed)
Carelink Summary Report / Loop Recorder 

## 2021-03-16 ENCOUNTER — Telehealth: Payer: Self-pay | Admitting: Family Medicine

## 2021-03-16 NOTE — Telephone Encounter (Signed)
Attempted to schedule AWV. Unable to LVM.  Will try at later time.  

## 2021-03-22 ENCOUNTER — Ambulatory Visit (INDEPENDENT_AMBULATORY_CARE_PROVIDER_SITE_OTHER): Payer: Medicare Other

## 2021-03-22 DIAGNOSIS — R55 Syncope and collapse: Secondary | ICD-10-CM | POA: Diagnosis not present

## 2021-03-22 LAB — CUP PACEART REMOTE DEVICE CHECK
Date Time Interrogation Session: 20230312230628
Implantable Pulse Generator Implant Date: 20200813

## 2021-04-02 NOTE — Progress Notes (Signed)
Carelink Summary Report / Loop Recorder 

## 2021-04-26 ENCOUNTER — Ambulatory Visit (INDEPENDENT_AMBULATORY_CARE_PROVIDER_SITE_OTHER): Payer: Medicare Other

## 2021-04-26 DIAGNOSIS — R55 Syncope and collapse: Secondary | ICD-10-CM

## 2021-04-26 LAB — CUP PACEART REMOTE DEVICE CHECK
Date Time Interrogation Session: 20230414231136
Implantable Pulse Generator Implant Date: 20200813

## 2021-05-12 NOTE — Progress Notes (Signed)
Carelink Summary Report / Loop Recorder 

## 2021-05-28 LAB — CUP PACEART REMOTE DEVICE CHECK
Date Time Interrogation Session: 20230517232032
Implantable Pulse Generator Implant Date: 20200813

## 2021-05-31 ENCOUNTER — Ambulatory Visit (INDEPENDENT_AMBULATORY_CARE_PROVIDER_SITE_OTHER): Payer: Medicare Other

## 2021-05-31 DIAGNOSIS — R55 Syncope and collapse: Secondary | ICD-10-CM | POA: Diagnosis not present

## 2021-06-17 NOTE — Progress Notes (Signed)
Carelink Summary Report / Loop Recorder 

## 2021-07-05 ENCOUNTER — Ambulatory Visit (INDEPENDENT_AMBULATORY_CARE_PROVIDER_SITE_OTHER): Payer: Medicare Other

## 2021-07-05 DIAGNOSIS — R55 Syncope and collapse: Secondary | ICD-10-CM | POA: Diagnosis not present

## 2021-07-06 LAB — CUP PACEART REMOTE DEVICE CHECK
Date Time Interrogation Session: 20230619232441
Implantable Pulse Generator Implant Date: 20200813

## 2021-07-29 NOTE — Progress Notes (Signed)
Carelink Summary Report / Loop Recorder 

## 2021-07-31 ENCOUNTER — Encounter: Payer: Self-pay | Admitting: Family Medicine

## 2021-08-05 LAB — CUP PACEART REMOTE DEVICE CHECK
Date Time Interrogation Session: 20230722233542
Implantable Pulse Generator Implant Date: 20200813

## 2021-08-10 ENCOUNTER — Telehealth: Payer: Self-pay | Admitting: Internal Medicine

## 2021-08-10 ENCOUNTER — Encounter: Payer: Self-pay | Admitting: Internal Medicine

## 2021-08-10 NOTE — Telephone Encounter (Signed)
Spouse calling to get the serial number for pt's Loop Monitor. Please advise

## 2021-08-11 ENCOUNTER — Telehealth: Payer: Self-pay

## 2021-08-11 NOTE — Telephone Encounter (Signed)
Pt is now being seen by [facility]. I have canceled all upcoming appointments, and marked her inactive in [place].

## 2021-09-01 IMAGING — MR MR HEAD W/O CM
10 series · 48 of 48 positions shown · non-contrast
Comparison: None.

CLINICAL DATA: Recurrent episodes of dizziness with fatigue and
nausea for 2 years.

EXAM:
MRI HEAD WITHOUT CONTRAST
TECHNIQUE: Multiplanar, multiecho pulse sequences of the brain and surrounding
structures were obtained without intravenous contrast.

[Series 5: T1 · sagittal · 4.0mm · 0.75mm/px · 4 of 31 slices shown (1 of 2)]
[im 1/31]
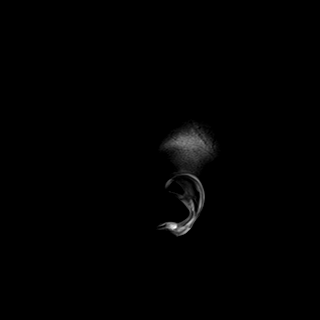
[im 11/31]
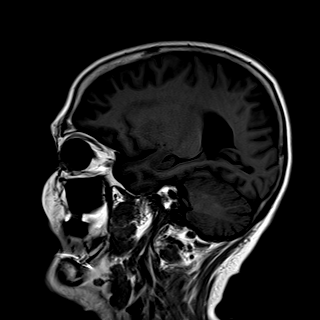
[im 21/31]
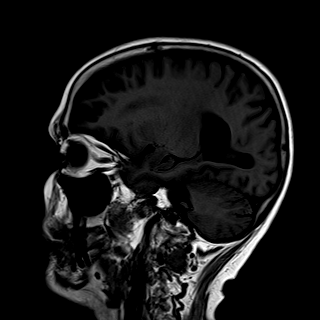
[im 31/31]
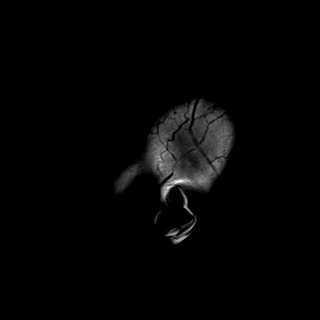

[Series 6: DWI · axial · 3.0mm · 1.44mm/px · z∈[-52,+82]mm · 7 of 84 slices shown (1 of 4)]
[im 1/84]
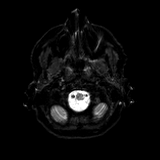
[im 14/84]
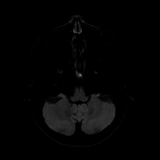
[im 28/84]
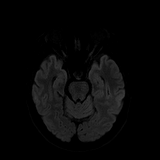
[im 42/84]
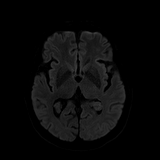
[im 56/84]
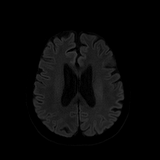
[im 70/84]
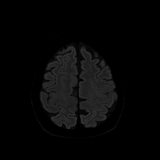
[im 84/84]
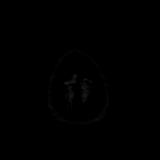

[Series 7: DWI · axial · 3.0mm · 1.44mm/px · z∈[-52,+82]mm · 3 of 42 slices shown (2 of 4)]
[im 1/42]
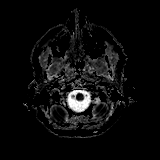
[im 21/42]
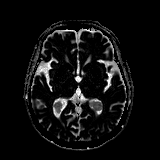
[im 42/42]
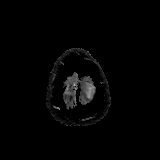

[Series 8: DWI · coronal · 5.0mm · 1.44mm/px · 5 of 60 slices shown (3 of 4)]
[im 1/60]
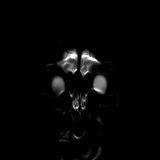
[im 15/60]
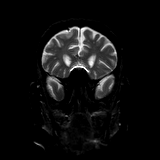
[im 30/60]
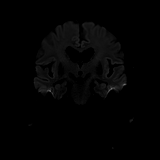
[im 45/60]
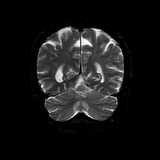
[im 60/60]
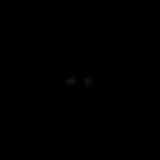

[Series 9: DWI · coronal · 5.0mm · 1.44mm/px · 2 of 30 slices shown (4 of 4)]
[im 1/30]
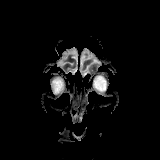
[im 30/30]
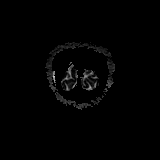

[Series 10: T2 · axial · 4.0mm · 0.36mm/px · z∈[-56,+79]mm · 2 of 27 slices shown (1 of 2)]
[im 1/27]
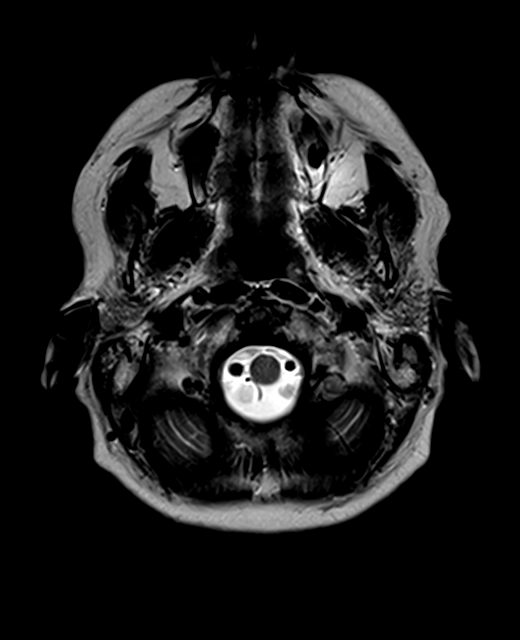
[im 27/27]
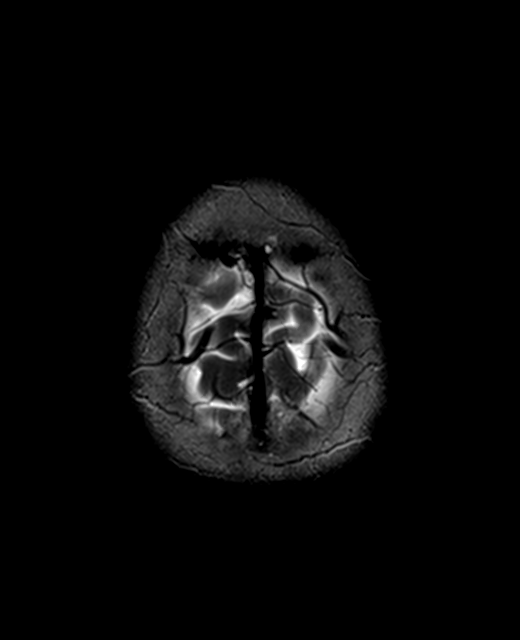

[Series 11: FLAIR · axial · 3.0mm · 0.72mm/px · z∈[-62,+87]mm · 2 of 26 slices shown]
[im 1/26]
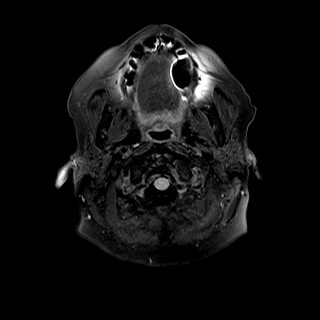
[im 26/26]
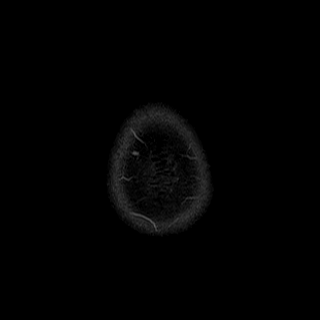

[Series 13: swi_images · axial · 1.5mm · 0.90mm/px · z∈[-59,+83]mm · 8 of 96 slices shown]
[im 1/96]
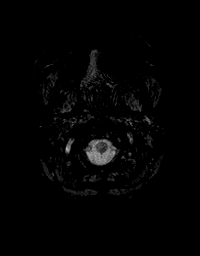
[im 14/96]
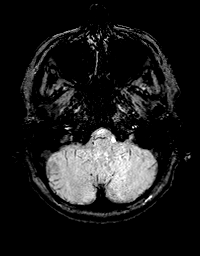
[im 28/96]
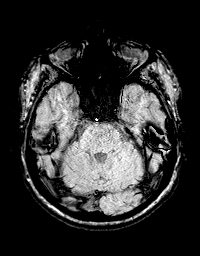
[im 41/96]
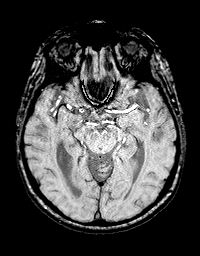
[im 55/96]
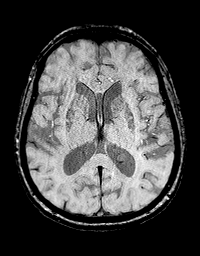
[im 68/96]
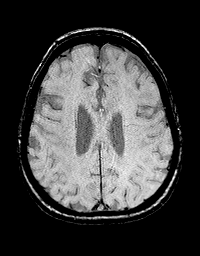
[im 82/96]
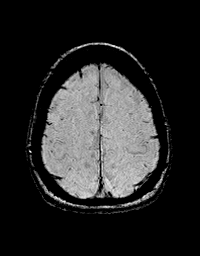
[im 96/96]
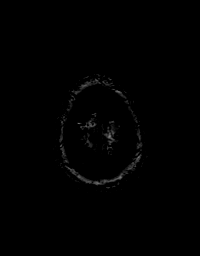

[Series 14: T1 · axial · 1.0mm · 0.94mm/px · z∈[-67,+91]mm · 13 of 160 slices shown (2 of 2)]
[im 1/160]
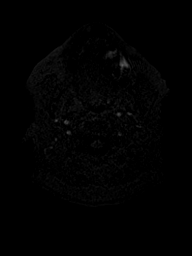
[im 14/160]
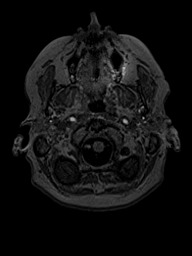
[im 27/160]
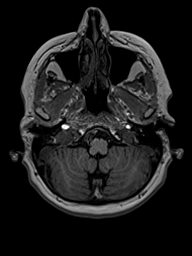
[im 40/160]
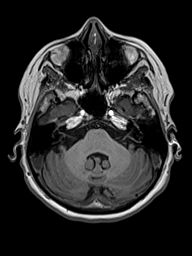
[im 54/160]
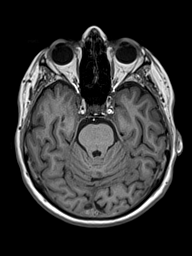
[im 67/160]
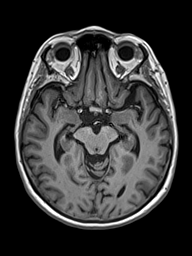
[im 80/160]
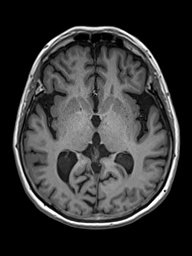
[im 93/160]
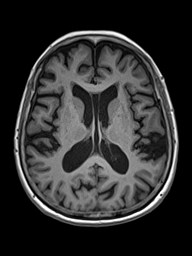
[im 107/160]
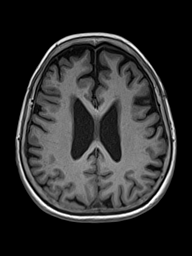
[im 120/160]
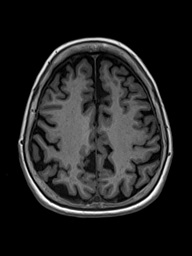
[im 133/160]
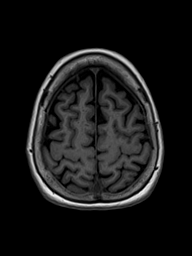
[im 146/160]
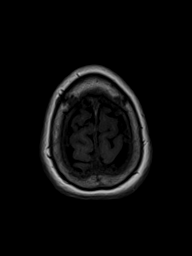
[im 160/160]
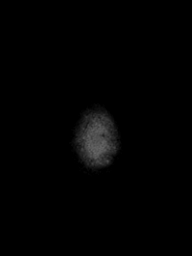

[Series 15: T2 · coronal · 4.5mm · 0.36mm/px · 2 of 30 slices shown (2 of 2)]
[im 1/30]
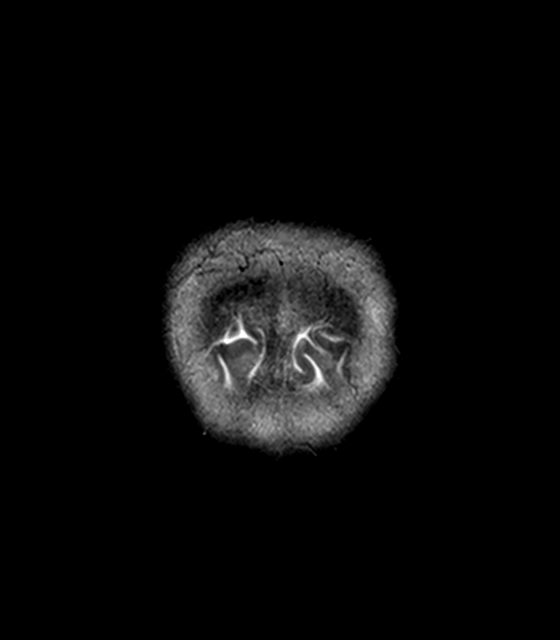
[im 30/30]
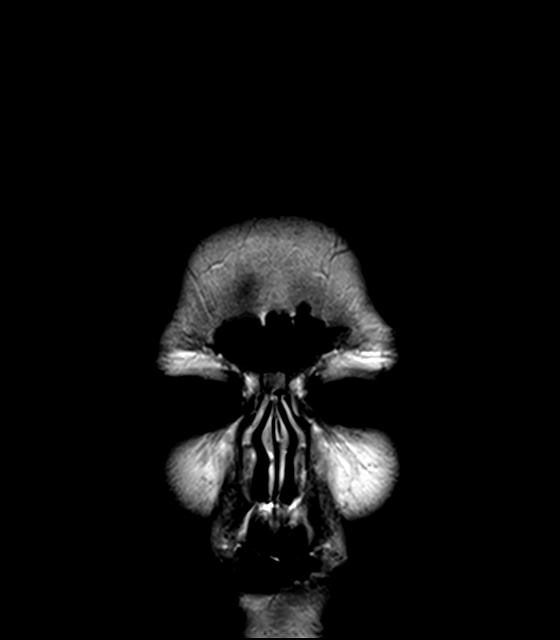

[48 of 48 positions shown; findings below may reference images not displayed]

FINDINGS: Brain: There is no evidence of acute infarct, intracranial
hemorrhage, mass, midline shift, or extra-axial fluid collection. A
small cavum septum pellucidum et vergae is incidentally noted. T2
hyperintensities in the subcortical and periventricular white matter
bilaterally are nonspecific but compatible with minimal chronic
small vessel ischemic disease. There is mild generalized cerebral
atrophy.

Vascular: Major intracranial vascular flow voids are preserved.

Skull and upper cervical spine: No suspicious marrow lesion. Mild to
moderate C1-2 arthropathy.

Sinuses/Orbits: Bilateral cataract extraction. Clear paranasal
sinuses. At most trace mastoid fluid bilaterally.

Other: None.
IMPRESSION: 1. No acute intracranial abnormality.
2. Minimal chronic small vessel ischemic disease and mild cerebral
atrophy.

## 2021-10-04 ENCOUNTER — Encounter: Payer: Self-pay | Admitting: *Deleted

## 2021-12-23 ENCOUNTER — Encounter: Payer: Self-pay | Admitting: *Deleted
